# Patient Record
Sex: Female | Born: 1935 | Race: White | Hispanic: No | Smoking: Former smoker
Health system: Southern US, Community
[De-identification: ages and names within clinical notes are randomized; demographics above are authoritative.]

## PROBLEM LIST (undated history)

## (undated) DIAGNOSIS — K219 Gastro-esophageal reflux disease without esophagitis: Secondary | ICD-10-CM

## (undated) DIAGNOSIS — K449 Diaphragmatic hernia without obstruction or gangrene: Secondary | ICD-10-CM

## (undated) DIAGNOSIS — I4891 Unspecified atrial fibrillation: Secondary | ICD-10-CM

## (undated) DIAGNOSIS — G250 Essential tremor: Secondary | ICD-10-CM

## (undated) DIAGNOSIS — K222 Esophageal obstruction: Secondary | ICD-10-CM

## (undated) DIAGNOSIS — K2270 Barrett's esophagus without dysplasia: Secondary | ICD-10-CM

## (undated) DIAGNOSIS — I10 Essential (primary) hypertension: Secondary | ICD-10-CM

## (undated) HISTORY — DX: Unspecified atrial fibrillation: I48.91

## (undated) HISTORY — PX: ESOPHAGOSCOPY WITH DILITATION: SHX5618

## (undated) HISTORY — DX: Essential tremor: G25.0

## (undated) HISTORY — DX: Barrett's esophagus without dysplasia: K22.70

## (undated) HISTORY — DX: Diaphragmatic hernia without obstruction or gangrene: K44.9

## (undated) HISTORY — PX: ROTATOR CUFF REPAIR: SHX139

## (undated) HISTORY — DX: Gastro-esophageal reflux disease without esophagitis: K21.9

## (undated) HISTORY — PX: ABDOMINAL HYSTERECTOMY: SHX81

## (undated) HISTORY — PX: KNEE ARTHROSCOPY: SHX127

## (undated) HISTORY — DX: Esophageal obstruction: K22.2

---

## 2002-11-19 ENCOUNTER — Encounter: Payer: Self-pay | Admitting: Orthopedic Surgery

## 2002-11-19 ENCOUNTER — Ambulatory Visit (HOSPITAL_COMMUNITY): Admission: RE | Admit: 2002-11-19 | Discharge: 2002-11-19 | Payer: Self-pay | Admitting: Orthopedic Surgery

## 2002-12-01 ENCOUNTER — Ambulatory Visit (HOSPITAL_BASED_OUTPATIENT_CLINIC_OR_DEPARTMENT_OTHER): Admission: RE | Admit: 2002-12-01 | Discharge: 2002-12-02 | Payer: Self-pay | Admitting: Orthopedic Surgery

## 2002-12-01 ENCOUNTER — Encounter: Admission: RE | Admit: 2002-12-01 | Discharge: 2002-12-01 | Payer: Self-pay | Admitting: Orthopedic Surgery

## 2002-12-01 ENCOUNTER — Encounter: Payer: Self-pay | Admitting: Orthopedic Surgery

## 2003-06-18 ENCOUNTER — Ambulatory Visit (HOSPITAL_COMMUNITY): Admission: RE | Admit: 2003-06-18 | Discharge: 2003-06-18 | Payer: Self-pay | Admitting: Orthopedic Surgery

## 2003-06-18 ENCOUNTER — Encounter: Payer: Self-pay | Admitting: Orthopedic Surgery

## 2012-09-26 ENCOUNTER — Other Ambulatory Visit: Payer: Self-pay | Admitting: Family Medicine

## 2012-09-26 DIAGNOSIS — R200 Anesthesia of skin: Secondary | ICD-10-CM

## 2012-10-03 ENCOUNTER — Ambulatory Visit
Admission: RE | Admit: 2012-10-03 | Discharge: 2012-10-03 | Disposition: A | Discharge status: Home / Self Care | Payer: Medicare Other | Source: Ambulatory Visit | Attending: Family Medicine | Admitting: Family Medicine

## 2012-10-03 DIAGNOSIS — R200 Anesthesia of skin: Secondary | ICD-10-CM

## 2014-06-15 ENCOUNTER — Other Ambulatory Visit: Payer: Self-pay | Admitting: Family Medicine

## 2014-06-15 DIAGNOSIS — R1310 Dysphagia, unspecified: Secondary | ICD-10-CM

## 2014-06-18 ENCOUNTER — Ambulatory Visit
Admission: RE | Admit: 2014-06-18 | Discharge: 2014-06-18 | Disposition: A | Discharge status: Home / Self Care | Payer: Medicare Other | Source: Ambulatory Visit | Attending: Family Medicine | Admitting: Family Medicine

## 2014-06-18 DIAGNOSIS — R1310 Dysphagia, unspecified: Secondary | ICD-10-CM

## 2014-06-29 ENCOUNTER — Encounter: Payer: Self-pay | Admitting: Gastroenterology

## 2014-08-26 ENCOUNTER — Encounter: Payer: Self-pay | Admitting: Gastroenterology

## 2014-08-26 ENCOUNTER — Other Ambulatory Visit (INDEPENDENT_AMBULATORY_CARE_PROVIDER_SITE_OTHER): Payer: Medicare Other

## 2014-08-26 ENCOUNTER — Ambulatory Visit (INDEPENDENT_AMBULATORY_CARE_PROVIDER_SITE_OTHER): Payer: Medicare Other | Admitting: Gastroenterology

## 2014-08-26 VITALS — BP 152/76 | HR 64 | Ht 67.5 in | Wt 138.0 lb

## 2014-08-26 DIAGNOSIS — Z1212 Encounter for screening for malignant neoplasm of rectum: Secondary | ICD-10-CM

## 2014-08-26 DIAGNOSIS — R531 Weakness: Secondary | ICD-10-CM

## 2014-08-26 DIAGNOSIS — G250 Essential tremor: Secondary | ICD-10-CM

## 2014-08-26 DIAGNOSIS — R1314 Dysphagia, pharyngoesophageal phase: Secondary | ICD-10-CM | POA: Insufficient documentation

## 2014-08-26 DIAGNOSIS — Z1211 Encounter for screening for malignant neoplasm of colon: Secondary | ICD-10-CM

## 2014-08-26 LAB — CBC WITH DIFFERENTIAL/PLATELET
BASOS ABS: 0 10*3/uL (ref 0.0–0.1)
Basophils Relative: 0.7 % (ref 0.0–3.0)
Eosinophils Absolute: 0.3 10*3/uL (ref 0.0–0.7)
Eosinophils Relative: 4.7 % (ref 0.0–5.0)
HCT: 39.5 % (ref 36.0–46.0)
Hemoglobin: 13.3 g/dL (ref 12.0–15.0)
LYMPHS ABS: 2 10*3/uL (ref 0.7–4.0)
LYMPHS PCT: 30.7 % (ref 12.0–46.0)
MCHC: 33.6 g/dL (ref 30.0–36.0)
MCV: 93.5 fl (ref 78.0–100.0)
Monocytes Absolute: 0.5 10*3/uL (ref 0.1–1.0)
Monocytes Relative: 7.9 % (ref 3.0–12.0)
NEUTROS ABS: 3.6 10*3/uL (ref 1.4–7.7)
NEUTROS PCT: 56 % (ref 43.0–77.0)
PLATELETS: 296 10*3/uL (ref 150.0–400.0)
RBC: 4.23 Mil/uL (ref 3.87–5.11)
RDW: 12.7 % (ref 11.5–15.5)
WBC: 6.4 10*3/uL (ref 4.0–10.5)

## 2014-08-26 NOTE — Assessment & Plan Note (Signed)
Dysphagia secondary to distal peptic esophageal stricture  Recommendations #1 upper endoscopy with dilation as indicated  Risks, alternatives, and complications of the procedure, including bleeding, perforation, and possible need for surgery, were explained to the patient.  Dysphagia is secondary to a peptic stricture.

## 2014-08-26 NOTE — Progress Notes (Signed)
  _                                                                                                                History of Present Illness:  Tanya Little is a pleasant 80-year-old white female referred for evaluation of dysphagia.  Over the past year she's had progressive dysphagia to solids and liquids.  Initially with swallowing she has chest pain as well.  Recent barium swallow, which I reviewed, demonstrated a stricture at the GE junction with holdup of a barium pill.  In the past she has had pyrosis which has spontaneously subsided.  Patient takes iron.  She thinks that she has been anemic and claims that she feels lightheaded and weak when she does not take iron.  There is no history of rectal bleeding or melena.  She is on no gastric irritants.  She has never had a colonoscopy.   Past Medical History  Diagnosis Date  . Barrett's esophagus   . Esophageal stricture   . Hiatal hernia   . Essential tremor   . GERD (gastroesophageal reflux disease)    Past Surgical History  Procedure Laterality Date  . Abdominal hysterectomy    . Rotator cuff repair Right    family history includes Aneurysm in her brother; Cancer in her other; Diabetes in her sister; Lung cancer in her brother. Current Outpatient Prescriptions  Medication Sig Dispense Refill  . diphenhydramine-acetaminophen (TYLENOL PM) 25-500 MG TABS Take 2 tablets by mouth at bedtime as needed.    . ferrous sulfate 325 (65 FE) MG tablet Take 325 mg by mouth daily with breakfast.    . Multiple Vitamins-Minerals (CENTRUM SILVER PO) Take 1 tablet by mouth daily.    . Omega-3 Fatty Acids (FISH OIL) 1200 MG CAPS Take 1 capsule by mouth daily.    . PHENobarbital (LUMINAL) 64.8 MG tablet Take 1 tablet by mouth 2 (two) times daily.     No current facility-administered medications for this visit.   Allergies as of 08/26/2014  . (No Known Allergies)    reports that she quit smoking about 10 years ago. Her smoking use  included Cigarettes. She smoked 0.00 packs per day. She has never used smokeless tobacco. She reports that she does not drink alcohol or use illicit drugs.   Review of Systems: she has a resting tremorPertinent positive and negative review of systems were noted in the above HPI section. All other review of systems were otherwise negative.  Vital signs were reviewed in today's medical record Physical Exam: General: Well developed , well nourished, no acute distress Skin: anicteric Head: Normocephalic and atraumatic Eyes:  sclerae anicteric, EOMI Ears: Normal auditory acuity Mouth: No deformity or lesions Neck: Supple, no masses or thyromegaly Lungs: Clear throughout to auscultation Heart: Regular rate and rhythm; no murmurs, rubs or bruits Abdomen: Soft, non tender and non distended. No masses, hepatosplenomegaly or hernias noted. Normal Bowel sounds Rectal:deferred Musculoskeletal: Symmetrical with no gross deformities  Skin: No lesions on visible extremities Pulses:  Normal   pulses noted Extremities: No clubbing, cyanosis, edema or deformities noted Neurological: Alert oriented x 4, grossly nonfocal. There is a resting tremor Cervical Nodes:  No significant cervical adenopathy Inguinal Nodes: No significant inguinal adenopathy Psychological:  Alert and cooperative. Normal mood and affect  See Assessment and Plan under Problem List

## 2014-08-26 NOTE — Assessment & Plan Note (Signed)
Patient has never had a colonoscopy.  She does complain of weakness which she thinks is improved with iron supplementation.  Plan to check CBC as well

## 2014-08-26 NOTE — Assessment & Plan Note (Signed)
Therapy per neurology

## 2014-08-26 NOTE — Patient Instructions (Signed)
You will go to the basement today for labs Your procedure has been scheduled at [facility] Separate instructions given It has been recommended to you by your physician that you have a(n) Colonoscopy  completed. Per your request, we did not schedule the procedure(s) today. Please contact our office at [phone] should you decide to have the procedure completed.

## 2014-08-27 ENCOUNTER — Encounter: Payer: Self-pay | Admitting: Gastroenterology

## 2014-08-27 ENCOUNTER — Ambulatory Visit (AMBULATORY_SURGERY_CENTER): Payer: Medicare Other | Admitting: Gastroenterology

## 2014-08-27 ENCOUNTER — Other Ambulatory Visit: Payer: Self-pay | Admitting: *Deleted

## 2014-08-27 VITALS — BP 174/72 | HR 63 | Temp 96.9°F | Resp 41 | Ht 67.5 in | Wt 138.0 lb

## 2014-08-27 DIAGNOSIS — K2270 Barrett's esophagus without dysplasia: Secondary | ICD-10-CM

## 2014-08-27 DIAGNOSIS — K208 Other esophagitis: Secondary | ICD-10-CM

## 2014-08-27 DIAGNOSIS — Z8719 Personal history of other diseases of the digestive system: Secondary | ICD-10-CM

## 2014-08-27 DIAGNOSIS — R1310 Dysphagia, unspecified: Secondary | ICD-10-CM

## 2014-08-27 DIAGNOSIS — K222 Esophageal obstruction: Secondary | ICD-10-CM

## 2014-08-27 DIAGNOSIS — R1319 Other dysphagia: Secondary | ICD-10-CM

## 2014-08-27 DIAGNOSIS — R1314 Dysphagia, pharyngoesophageal phase: Secondary | ICD-10-CM

## 2014-08-27 MED ORDER — SODIUM CHLORIDE 0.9 % IV SOLN
500.0000 mL | INTRAVENOUS | Status: DC
Start: 2014-08-27 — End: 2014-08-30

## 2014-08-27 NOTE — Op Note (Signed)
[facility] [address] [place], [id number]   1ENDOSCOPY PROCEDURE REPORT  PATIENT: Tanya Little, Tanya Little  MR#: [id number] BIRTHDATE: 1932-04-22 , 82  yrs. old GENDER: female ENDOSCOPIST: Louis Meckelobert D Kaplan, MD REFERRED BY:  Windle GuardWilson Elkins, M.D. PROCEDURE DATE:  08/27/2014 PROCEDURE:  EGD w/ biopsy and EGD w/ balloon dilation ASA CLASS:     Class II INDICATIONS:  dysphagia and history of Barrett's esophagus. MEDICATIONS: Monitored anesthesia care and Propofol 150 mg IV TOPICAL ANESTHETIC:  DESCRIPTION OF PROCEDURE: After the risks benefits and alternatives of the procedure were thoroughly explained, informed consent was obtained.  The LB [id number] endoscope was introduced through the mouth and advanced to the second portion of the duodenum , Without limitations.  The instrument was slowly withdrawn as the mucosa was fully examined.    ESOPHAGUS: There was a benign appearing stricture at the gastroesophageal junction and 36 cm from the incisors.  The stricture was traversable with resistance.  Multiple biopsies were performed using cold forceps because of a history of Barrett's esophagus..  Using a TTS-balloon the stricture was dilated up to 14mm. numbers 11?"[id number] balloon dilators were inflated for 30 seconds each  Following this dilation, there was a moderate amount of heme.   Except for the findings listed the EGD was otherwise normal.  Retroflexed views revealed no abnormalities. The scope was then withdrawn from the patient and the procedure completed.  COMPLICATIONS: There were no immediate complications.  ENDOSCOPIC IMPRESSION: 1.   There was a stricture at the gastroesophageal junction and 36 cm from the incisors; multiple biopsies were performed; Using a TTS-balloon the stricture was dilated up to 11mm; The balloon was held inflated for 30 seconds each; Following this dilation, there was a moderate amount of heme 2.   EGD was otherwise  normal  RECOMMENDATIONS: follow-up endoscopy with balloon dilation in 2-3 weeks  REPEAT EXAM:  eSigned:  Louis Meckelobert D Kaplan, MD 08/27/2014 3:33 PM    CC:

## 2014-08-27 NOTE — Patient Instructions (Addendum)

## 2014-08-27 NOTE — Progress Notes (Signed)
Report to PACU, RN, vss, BBS= Clear.  

## 2014-08-27 NOTE — Progress Notes (Signed)
Called to room to assist during endoscopic procedure.  Patient ID and intended procedure confirmed with present staff. Received instructions for my participation in the procedure from the performing physician.  

## 2014-08-30 ENCOUNTER — Telehealth: Payer: Self-pay

## 2014-08-30 NOTE — Telephone Encounter (Signed)
  Follow up Call-  Call back number 08/27/2014  Post procedure Call Back phone  # [phone]  Permission to leave phone message Yes     Patient questions:  Do you have a fever, pain , or abdominal swelling? No. Pain Score  0 *  Have you tolerated food without any problems? Yes.    Have you been able to return to your normal activities? Yes.    Do you have any questions about your discharge instructions: Diet   No. Medications  No. Follow up visit  No.  Do you have questions or concerns about your Care? No.  Actions: * If pain score is 4 or above: No action needed, pain <4.

## 2014-08-31 ENCOUNTER — Other Ambulatory Visit: Payer: Self-pay

## 2014-08-31 DIAGNOSIS — R1314 Dysphagia, pharyngoesophageal phase: Secondary | ICD-10-CM

## 2014-09-03 ENCOUNTER — Telehealth: Payer: Self-pay | Admitting: Gastroenterology

## 2014-09-03 ENCOUNTER — Encounter: Payer: Self-pay | Admitting: Gastroenterology

## 2014-09-03 NOTE — Telephone Encounter (Signed)
[facility] with dilation [facility]. Appointment with [facility]visit and [facility]

## 2014-09-06 ENCOUNTER — Encounter (HOSPITAL_COMMUNITY)
Admission: RE | Disposition: A | Discharge status: Home / Self Care | Payer: Self-pay | Source: Ambulatory Visit | Attending: Gastroenterology

## 2014-09-06 ENCOUNTER — Ambulatory Visit (HOSPITAL_COMMUNITY)
Admission: RE | Admit: 2014-09-06 | Discharge: 2014-09-06 | Disposition: A | Discharge status: Home / Self Care | Payer: Medicare Other | Source: Ambulatory Visit | Attending: Gastroenterology | Admitting: Gastroenterology

## 2014-09-06 ENCOUNTER — Encounter (HOSPITAL_COMMUNITY): Payer: Self-pay | Admitting: *Deleted

## 2014-09-06 DIAGNOSIS — K210 Gastro-esophageal reflux disease with esophagitis: Secondary | ICD-10-CM | POA: Diagnosis not present

## 2014-09-06 DIAGNOSIS — G250 Essential tremor: Secondary | ICD-10-CM | POA: Diagnosis not present

## 2014-09-06 DIAGNOSIS — Z87891 Personal history of nicotine dependence: Secondary | ICD-10-CM | POA: Diagnosis not present

## 2014-09-06 DIAGNOSIS — K222 Esophageal obstruction: Secondary | ICD-10-CM | POA: Diagnosis not present

## 2014-09-06 DIAGNOSIS — R1310 Dysphagia, unspecified: Secondary | ICD-10-CM

## 2014-09-06 DIAGNOSIS — K209 Esophagitis, unspecified: Secondary | ICD-10-CM | POA: Diagnosis not present

## 2014-09-06 DIAGNOSIS — K2270 Barrett's esophagus without dysplasia: Secondary | ICD-10-CM | POA: Diagnosis not present

## 2014-09-06 HISTORY — PX: BALLOON DILATION: SHX5330

## 2014-09-06 HISTORY — PX: ESOPHAGOGASTRODUODENOSCOPY: SHX5428

## 2014-09-06 SURGERY — EGD (ESOPHAGOGASTRODUODENOSCOPY)
Anesthesia: Moderate Sedation

## 2014-09-06 MED ORDER — DIPHENHYDRAMINE HCL 50 MG/ML IJ SOLN
INTRAMUSCULAR | Status: AC
  Filled 2014-09-06: qty 1

## 2014-09-06 MED ORDER — FENTANYL CITRATE 0.05 MG/ML IJ SOLN
INTRAMUSCULAR | Status: AC
  Filled 2014-09-06: qty 4

## 2014-09-06 MED ORDER — OMEPRAZOLE 20 MG PO CPDR: 20.0000 mg | DELAYED_RELEASE_CAPSULE | Freq: Every day | ORAL | Status: DC

## 2014-09-06 MED ORDER — MIDAZOLAM HCL 10 MG/2ML IJ SOLN
INTRAMUSCULAR | Status: AC
  Filled 2014-09-06: qty 4

## 2014-09-06 MED ORDER — MIDAZOLAM HCL 10 MG/2ML IJ SOLN
INTRAMUSCULAR | Status: DC | PRN
  Administered 2014-09-06: 1 mg via INTRAVENOUS
  Administered 2014-09-06: 2 mg via INTRAVENOUS

## 2014-09-06 MED ORDER — BUTAMBEN-TETRACAINE-BENZOCAINE 2-2-14 % EX AERO
INHALATION_SPRAY | CUTANEOUS | Status: DC | PRN
  Administered 2014-09-06: 2 via TOPICAL

## 2014-09-06 MED ORDER — FENTANYL CITRATE 0.05 MG/ML IJ SOLN
INTRAMUSCULAR | Status: DC | PRN
  Administered 2014-09-06 (×2): 25 ug via INTRAVENOUS

## 2014-09-06 MED ORDER — SODIUM CHLORIDE 0.9 % IV SOLN
INTRAVENOUS | Status: DC
  Administered 2014-09-06: 500 mL via INTRAVENOUS

## 2014-09-06 NOTE — Discharge Instructions (Signed)
Esophagogastroduodenoscopy °Care After °Refer to this sheet in the next few weeks. These instructions provide you with information on caring for yourself after your procedure. Your caregiver may also give you more specific instructions. Your treatment has been planned according to current medical practices, but problems sometimes occur. Call your caregiver if you have any problems or questions after your procedure.  °HOME CARE INSTRUCTIONS °· Do not eat or drink anything until the numbing medicine (local anesthetic) has worn off and your gag reflex has returned. You will know that the local anesthetic has worn off when you can swallow comfortably. °· Do not drive for 12 hours after the procedure or as directed by your caregiver. °· Only take medicines as directed by your caregiver. °SEEK MEDICAL CARE IF:  °· You cannot stop coughing. °· You are not urinating at all or less than usual. °SEEK IMMEDIATE MEDICAL CARE IF: °· You have difficulty swallowing. °· You cannot eat or drink. °· You have worsening throat or chest pain. °· You have dizziness, lightheadedness, or you faint. °· You have nausea or vomiting. °· You have chills. °· You have a fever. °· You have severe abdominal pain. °· You have black, tarry, or bloody stools. °Document Released: 09/17/2012 Document Reviewed: 09/17/2012 °ExitCare® Patient Information ©2015 [employer]. This information is not intended to replace advice given to you by your health care provider. Make sure you discuss any questions you have with your health care provider. ° °

## 2014-09-06 NOTE — Interval H&P Note (Signed)
History and Physical Interval Note:  09/06/2014 9:31 AM  Tanya Little  has presented today for surgery, with the diagnosis of dysphagia  The various methods of treatment have been discussed with the patient and family. After consideration of risks, benefits and other options for treatment, the patient has consented to  Procedure(s): ESOPHAGOGASTRODUODENOSCOPY (EGD) (N/A) BALLOON DILATION (N/A) as a surgical intervention .  The patient's history has been reviewed, patient examined, no change in status, stable for surgery.  I have reviewed the patient's chart and labs.  Questions were answered to the patient's satisfaction.   Upper endoscopy demonstrated a peptic stricture which was dilated to 14 mm.  Patient presents today for follow-up endoscopy and dilation therapy for a distal esophageal stricture.  Melvia Heapsobert Kaplan

## 2014-09-06 NOTE — H&P (View-Only) (Signed)
_                                                                                                                History of Present Illness:  Ms. Tanya Little is a pleasant 80 year old white female referred for evaluation of dysphagia.  Over the past year she's had progressive dysphagia to solids and liquids.  Initially with swallowing she has chest pain as well.  Recent barium swallow, which I reviewed, demonstrated a stricture at the GE junction with holdup of a barium pill.  In the past she has had pyrosis which has spontaneously subsided.  Patient takes iron.  She thinks that she has been anemic and claims that she feels lightheaded and weak when she does not take iron.  There is no history of rectal bleeding or melena.  She is on no gastric irritants.  She has never had a colonoscopy.   Past Medical History  Diagnosis Date  . Barrett's esophagus   . Esophageal stricture   . Hiatal hernia   . Essential tremor   . GERD (gastroesophageal reflux disease)    Past Surgical History  Procedure Laterality Date  . Abdominal hysterectomy    . Rotator cuff repair Right    family history includes Aneurysm in her brother; Cancer in her other; Diabetes in her sister; Lung cancer in her brother. Current Outpatient Prescriptions  Medication Sig Dispense Refill  . diphenhydramine-acetaminophen (TYLENOL PM) 25-500 MG TABS Take 2 tablets by mouth at bedtime as needed.    . ferrous sulfate 325 (65 FE) MG tablet Take 325 mg by mouth daily with breakfast.    . Multiple Vitamins-Minerals (CENTRUM SILVER PO) Take 1 tablet by mouth daily.    . Omega-3 Fatty Acids (FISH OIL) 1200 MG CAPS Take 1 capsule by mouth daily.    Marland Kitchen. PHENobarbital (LUMINAL) 64.8 MG tablet Take 1 tablet by mouth 2 (two) times daily.     No current facility-administered medications for this visit.   Allergies as of 08/26/2014  . (No Known Allergies)    reports that she quit smoking about 10 years ago. Her smoking use  included Cigarettes. She smoked 0.00 packs per day. She has never used smokeless tobacco. She reports that she does not drink alcohol or use illicit drugs.   Review of Systems: she has a resting tremorPertinent positive and negative review of systems were noted in the above HPI section. All other review of systems were otherwise negative.  Vital signs were reviewed in today's medical record Physical Exam: General: Well developed , well nourished, no acute distress Skin: anicteric Head: Normocephalic and atraumatic Eyes:  sclerae anicteric, EOMI Ears: Normal auditory acuity Mouth: No deformity or lesions Neck: Supple, no masses or thyromegaly Lungs: Clear throughout to auscultation Heart: Regular rate and rhythm; no murmurs, rubs or bruits Abdomen: Soft, non tender and non distended. No masses, hepatosplenomegaly or hernias noted. Normal Bowel sounds Rectal:deferred Musculoskeletal: Symmetrical with no gross deformities  Skin: No lesions on visible extremities Pulses:  Normal  pulses noted Extremities: No clubbing, cyanosis, edema or deformities noted Neurological: Alert oriented x 4, grossly nonfocal. There is a resting tremor Cervical Nodes:  No significant cervical adenopathy Inguinal Nodes: No significant inguinal adenopathy Psychological:  Alert and cooperative. Normal mood and affect  See Assessment and Plan under Problem List

## 2014-09-06 NOTE — Op Note (Signed)
18 arland Kitc[id number] [phone] 3930hon HaWilmington Va Medical Cent8mr31Eye Surgical Center LLCCentral MonTre44s86w0dDonaly8175031Maisie F([phone] HaleterTAG>od [id number] Soon SMemorial Hospital Medical Center - ModestoastOwens-Illinoisl Hermann SurgerTresa Endo38Cen54w0dWUJDonalynn [phone] ShoMaisie [id number] [facility]52 Marland KitchenVanessa DurhaNorton Audubon Hospitalm Candelar75ma C203-3[facility]98-5088nt organiser46.9EnvTresa Endo70Gem24w0dWUJDonalynn [phone] ShMaisie [id number] Almira CoaSurgery Center At Liberty Hospital LLCsterKorea20 <BADTEXTLos Robles Hospital & [facility] Center6m3Owens-Illinoissal Center08-1743patient Surgery Center LPAtlantic Gastro[facility]ology EndoTresa Endo53opy60w0dWUJDonalynn [phone] ShMaisie FusHale2212-6891.4-7[facility]52959m66 Vaness29m Du682-0[facility]74-6503EXTTAG>CandelariPam Speciality Hospital Of New Braunfels Celeste

## 2014-09-06 NOTE — OR Nursing (Signed)
IV to right wrist removed with tip intact.  Site WNL.  Patient tolerated well.

## 2014-09-07 ENCOUNTER — Encounter (HOSPITAL_COMMUNITY): Admission: RE | Payer: Self-pay | Source: Ambulatory Visit

## 2014-09-07 ENCOUNTER — Ambulatory Visit (HOSPITAL_COMMUNITY): Admission: RE | Admit: 2014-09-07 | Payer: Medicare Other | Source: Ambulatory Visit | Admitting: Gastroenterology

## 2014-09-07 ENCOUNTER — Encounter (HOSPITAL_COMMUNITY): Payer: Self-pay | Admitting: Gastroenterology

## 2014-09-07 SURGERY — ESOPHAGOGASTRODUODENOSCOPY (EGD) WITH PROPOFOL
Anesthesia: Monitor Anesthesia Care

## 2014-10-11 ENCOUNTER — Telehealth: Payer: Self-pay | Admitting: Gastroenterology

## 2014-10-11 NOTE — Telephone Encounter (Signed)
The patient is having indigestion after eating. She takes her Omeprazole daily as ordered. She would like to try Nexium because her son says it works great for him and he never has any symptoms now. Okay to change? She has had an EGD with Dilation twice this year.

## 2014-10-12 MED ORDER — ESOMEPRAZOLE MAGNESIUM 40 MG PO CPDR: 40.0000 mg | DELAYED_RELEASE_CAPSULE | Freq: Every day | ORAL | Status: DC

## 2014-10-12 NOTE — Telephone Encounter (Signed)
Patient notified and rx sent to pharmacy. 

## 2014-10-12 NOTE — Telephone Encounter (Signed)
That's fine can switch to Nexium 40 mg po QAM

## 2014-10-22 ENCOUNTER — Encounter: Payer: Medicare Other | Admitting: Gastroenterology

## 2015-02-21 ENCOUNTER — Other Ambulatory Visit: Payer: Self-pay | Admitting: *Deleted

## 2015-02-21 MED ORDER — ESOMEPRAZOLE MAGNESIUM 40 MG PO CPDR: 40.0000 mg | DELAYED_RELEASE_CAPSULE | Freq: Every day | ORAL | Status: DC

## 2015-04-04 ENCOUNTER — Other Ambulatory Visit: Payer: Self-pay | Admitting: Family Medicine

## 2015-04-04 ENCOUNTER — Ambulatory Visit
Admission: RE | Admit: 2015-04-04 | Discharge: 2015-04-04 | Disposition: A | Discharge status: Home / Self Care | Payer: Medicare Other | Source: Ambulatory Visit | Attending: Family Medicine | Admitting: Family Medicine

## 2015-04-04 DIAGNOSIS — R609 Edema, unspecified: Secondary | ICD-10-CM

## 2015-04-04 DIAGNOSIS — T148XXA Other injury of unspecified body region, initial encounter: Secondary | ICD-10-CM

## 2015-07-04 ENCOUNTER — Other Ambulatory Visit: Payer: Self-pay | Admitting: *Deleted

## 2015-07-04 MED ORDER — ESOMEPRAZOLE MAGNESIUM 40 MG PO CPDR: 40.0000 mg | DELAYED_RELEASE_CAPSULE | Freq: Every day | ORAL | Status: DC

## 2015-09-19 ENCOUNTER — Telehealth: Payer: Self-pay | Admitting: Gastroenterology

## 2015-09-19 ENCOUNTER — Other Ambulatory Visit: Payer: Self-pay

## 2015-09-19 MED ORDER — NEXIUM 40 MG PO CPDR: 40.0000 mg | DELAYED_RELEASE_CAPSULE | Freq: Every day | ORAL | Status: DC

## 2015-09-19 NOTE — Telephone Encounter (Signed)
Spoke with the patient. She had previously been getting Nexium. It changed to generic about 3 months ago. Confirmed with the pharmacist. Patient reports she is having breakthrough symptoms. New Rx e-scribed and DAW marked. Patient will see new provider in the new year at first available.

## 2015-11-29 ENCOUNTER — Encounter: Payer: Self-pay | Admitting: Gastroenterology

## 2016-03-05 ENCOUNTER — Ambulatory Visit
Admission: RE | Admit: 2016-03-05 | Discharge: 2016-03-05 | Disposition: A | Discharge status: Home / Self Care | Payer: Medicare Other | Source: Ambulatory Visit | Attending: Family Medicine | Admitting: Family Medicine

## 2016-03-05 ENCOUNTER — Other Ambulatory Visit: Payer: Self-pay | Admitting: Family Medicine

## 2016-03-05 DIAGNOSIS — M545 Low back pain: Secondary | ICD-10-CM

## 2016-05-18 ENCOUNTER — Telehealth: Payer: Self-pay | Admitting: *Deleted

## 2016-05-18 NOTE — Telephone Encounter (Signed)
Pt called in requesting records from when she saw Dr. Thad Ranger. She would like a call back as soon as possible. [phone]

## 2016-05-29 ENCOUNTER — Other Ambulatory Visit: Payer: Self-pay | Admitting: Family Medicine

## 2016-05-29 DIAGNOSIS — I739 Peripheral vascular disease, unspecified: Secondary | ICD-10-CM

## 2016-06-05 ENCOUNTER — Ambulatory Visit
Admission: RE | Admit: 2016-06-05 | Discharge: 2016-06-05 | Disposition: A | Discharge status: Home / Self Care | Payer: Medicare Other | Source: Ambulatory Visit | Attending: Family Medicine | Admitting: Family Medicine

## 2016-06-05 DIAGNOSIS — I739 Peripheral vascular disease, unspecified: Secondary | ICD-10-CM

## 2016-11-26 ENCOUNTER — Encounter (INDEPENDENT_AMBULATORY_CARE_PROVIDER_SITE_OTHER): Payer: Self-pay

## 2016-11-26 ENCOUNTER — Telehealth: Payer: Self-pay | Admitting: Emergency Medicine

## 2016-11-26 ENCOUNTER — Ambulatory Visit (INDEPENDENT_AMBULATORY_CARE_PROVIDER_SITE_OTHER): Payer: Medicare Other | Admitting: Nurse Practitioner

## 2016-11-26 ENCOUNTER — Encounter: Payer: Self-pay | Admitting: Nurse Practitioner

## 2016-11-26 VITALS — BP 118/66 | HR 64 | Ht 67.5 in | Wt 137.0 lb

## 2016-11-26 DIAGNOSIS — K219 Gastro-esophageal reflux disease without esophagitis: Secondary | ICD-10-CM

## 2016-11-26 DIAGNOSIS — R1310 Dysphagia, unspecified: Secondary | ICD-10-CM

## 2016-11-26 NOTE — Patient Instructions (Addendum)
You have been scheduled for an endoscopy. Please follow written instructions given to you at your visit today. If you use inhalers (even only as needed), please bring them with you on the day of your procedure. Your physician has requested that you go to www.startemmi.com and enter the access code given to you at your visit today. This web site gives a general overview about your procedure. However, you should still follow specific instructions given to you by our office regarding your preparation for the procedure.  You will be contacted by our office prior to your procedure for directions on holding your Xarelto.  If you do not hear from our office 1 week prior to your scheduled procedure, please call [phone] to discuss.

## 2016-11-26 NOTE — Telephone Encounter (Signed)
11/26/2016   RE: Barron AlvineBetty G Kilian DOB: November 02, 1931 MRN: [id number]   Dear Dr. Aniceto Bossitcher,    We have scheduled the above patient for an endoscopic procedure. Our records show that she is on anticoagulation therapy.   Please advise as to how long the patient may come off her therapy of Xarelto prior to the procedure, which is scheduled for 12-05-16.  Please fax back/ or route the completed form to Deneise Leveresiree Domitila Stetler, CMA.   Sincerely,    Deneise Leveresiree Christell Steinmiller, CMA

## 2016-11-26 NOTE — Progress Notes (Addendum)
     HPI: Patient is an 83 year old female previously followed by Dr. Arlyce Little. She has a history of GERD/peptic stricture requiring dilation, last one November 2015. Patient is here today with recurrent dysphagia. No heartburn symptoms on daily Nexium. She takes Pb for tremors. [facility]  She was recently started on Xarelto for atrial fibrillation. No palpitation, chest pain or dyspnea.   Past Medical History:  Diagnosis Date  . Atrial fibrillation (HCC)   . Barrett's esophagus   . Esophageal stricture   . Essential tremor   . GERD (gastroesophageal reflux disease)   . Hiatal hernia     Patient's surgical history, family medical history, social history, medications and allergies were all reviewed in Epic    Physical Exam: BP 118/66   Pulse 64   Ht 5' 7.5" (1.715 m)   Wt 137 lb (62.1 kg)   BMI 21.14 kg/m   GENERAL: pleasant white female in NAD PSYCH: :Pleasant, cooperative, normal affect HEENT: Normocephalic, conjunctiva pink, mucous membranes moist, neck supple without masses CARDIAC:  Regular rate, irregular rhythm.  No murmur heard,  No peripheral edema PULM: Normal respiratory effort, lungs CTA bilaterally, no wheezing ABDOMEN:  soft, nontender, nondistended, no obvious masses, no hepatomegaly,  normal bowel sounds SKIN:  turgor, no lesions seen Musculoskeletal:  Normal muscle tone, normal strength NEURO: Alert and oriented x 3, tremors in both hands, voice slightly tremulous  ASSESSMENT and PLAN:  731. 83 year old female with recurrent solid food dysphagia and history of peptic strictures requiring dilation, last one 2015 Tanya Dice(Kaplan). She could have recurrent stricture vs dysmotility. Also,  she is [facility] tremors [facility]. She is being evaluated for possible cervical dystonia. Not sure but wonder if dysphagia be somehow be related to cervical dystonia. -Patient will be scheduled for EGD with possible  dilation. The risks and benefits of EGD with dilation were discussed and the patient agrees to proceed. Tanya Little will assume care going forward.   2. Atrial fibrillation, rate controlled. On xarelto.  -Hold Xarelto for 2 days before procedure - will instruct when and how to resume after procedure. Patient understand their is a low but real risk of stroke off Xarelto but agrees to proceed.  Will communicate by phone or EMR with patient's prescribing provider to confirm that holding Xarelto is reasonable in this case.    Tanya Little , NP 11/26/2016, 1:43 PM  Agree with Tanya Little's assessment and plan. Tanya Booparl E. Gessner, MD, Clementeen GrahamFACG

## 2016-11-28 NOTE — Telephone Encounter (Signed)
Called back no answer 

## 2016-11-28 NOTE — Telephone Encounter (Signed)
Spoke to patient and informed her I called Dr. Wendee Coppichter's office and let them know that we need a response this week. They informed me that she is out of the office today and she will be back in the office tomorrow. I will continue to follow up until we get an answer.

## 2016-11-30 NOTE — Telephone Encounter (Signed)
Spoke to Tanya Little at Dr. Wendee Coppichter's office and they advised patient to hold Xarelto the day before and day of her procedure and resume it the day after.

## 2016-12-05 ENCOUNTER — Encounter: Payer: Self-pay | Admitting: Internal Medicine

## 2016-12-05 ENCOUNTER — Ambulatory Visit (AMBULATORY_SURGERY_CENTER): Payer: Medicare Other | Admitting: Internal Medicine

## 2016-12-05 VITALS — BP 145/83 | HR 94 | Temp 97.1°F | Resp 9 | Ht 67.5 in | Wt 137.0 lb

## 2016-12-05 DIAGNOSIS — R1310 Dysphagia, unspecified: Secondary | ICD-10-CM | POA: Diagnosis present

## 2016-12-05 DIAGNOSIS — R1319 Other dysphagia: Secondary | ICD-10-CM

## 2016-12-05 DIAGNOSIS — K222 Esophageal obstruction: Secondary | ICD-10-CM

## 2016-12-05 MED ORDER — NEXIUM 40 MG PO CPDR: 40.0000 mg | DELAYED_RELEASE_CAPSULE | Freq: Every day | ORAL | 3 refills | Status: DC

## 2016-12-05 MED ORDER — SODIUM CHLORIDE 0.9 % IV SOLN: 500.0000 mL | INTRAVENOUS | Status: DC

## 2016-12-05 NOTE — Patient Instructions (Addendum)
I dilated the esophageal stricture - it should make a big difference.  Please restart Xarelto tomorrow.  Make sure to take the Nexium (esomeprazole) I sent in a new prescription. I want you to take it 30 minutes before breakfast.  I appreciate the opportunity to care for you. Iva Booparl E. Garlon Tuggle, MD, FACG     YOU HAD AN ENDOSCOPIC PROCEDURE TODAY AT THE [facility]:   Refer to the procedure report that was given to you for any specific questions about what was found during the examination.  If the procedure report does not answer your questions, please call your gastroenterologist to clarify.  If you requested that your care partner not be given the details of your procedure findings, then the procedure report has been included in a sealed envelope for you to review at your convenience later.  YOU SHOULD EXPECT: Some feelings of bloating in the abdomen. Passage of more gas than usual.  Walking can help get rid of the air that was put into your GI tract during the procedure and reduce the bloating. If you had a lower endoscopy (such as a colonoscopy or flexible sigmoidoscopy) you may notice spotting of blood in your stool or on the toilet paper. If you underwent a bowel prep for your procedure, you may not have a normal bowel movement for a few days.  Please Note:  You might notice some irritation and congestion in your nose or some drainage.  This is from the oxygen used during your procedure.  There is no need for concern and it should clear up in a day or so.  SYMPTOMS TO REPORT IMMEDIATELY:    Following upper endoscopy (EGD)  Vomiting of blood or coffee ground material  New chest pain or pain under the shoulder blades  Painful or persistently difficult swallowing  New shortness of breath  Fever of 100F or higher  Black, tarry-looking stools  For urgent or emergent issues, a gastroenterologist can be reached at any hour by calling [phone].   DIET:  Please  follow the dilatation diet the rest of today.  A handout was given to your daughter. Drink plenty of fluids but you should avoid alcoholic beverages for 24 hours.  ACTIVITY:  You should plan to take it easy for the rest of today and you should NOT DRIVE or use heavy machinery until tomorrow (because of the sedation medicines used during the test).    FOLLOW UP: Our staff will call the number listed on your records the next business day following your procedure to check on you and address any questions or concerns that you may have regarding the information given to you following your procedure. If we do not reach you, we will leave a message.  However, if you are feeling well and you are not experiencing any problems, there is no need to return our call.  We will assume that you have returned to your regular daily activities without incident.  If any biopsies were taken you will be contacted by phone or by letter within the next 1-3 weeks.  Please call us at [phone] if you have not heard about the biopsies in 3 weeks.    SIGNATURES/CONFIDENTIALITY: You and/or your care partner have signed paperwork which will be entered into your electronic medical record.  These signatures attest to the fact that that the information above on your After Visit Summary has been reviewed and is understood.  Full responsibility of the confidentiality of this  discharge information lies with you and/or your care-partner.   Handouts were given to your care partner on the dilatation diet to follow the rest of today and a hiatal hernia. You may resume your current medications today.  Be sure to stay on PPI (acid reducing med) and take it 30 minutes before breakfast.  Please resume XARELTO at prior dose tomorrow per Dr. Leone Payor. Repeat upper Endoscopy as needed for retreatment. Please call if any questions or concerns.

## 2016-12-05 NOTE — Op Note (Signed)
[facility] Patient Name: Tanya ScrapeBetty Traweek Procedure Date: 12/05/2016 10:29 AM MRN: [id number] Endoscopist: Iva Booparl E Fayelynn Distel , MD Age: 83 Referring MD:  Date of Birth: November 23, 1931 Gender: Female Account #: [id number] Procedure:                Upper GI endoscopy Indications:              Dysphagia, Stricture of the esophagus, For therapy                            of esophageal stricture Medicines:                Propofol per Anesthesia, Monitored Anesthesia Care Procedure:                Pre-Anesthesia Assessment:                           - Prior to the procedure, a History and Physical                            was performed, and patient medications and                            allergies were reviewed. The patient's tolerance of                            previous anesthesia was also reviewed. The risks                            and benefits of the procedure and the sedation                            options and risks were discussed with the patient.                            All questions were answered, and informed consent                            was obtained. Prior Anticoagulants: The patient                            last took Xarelto (rivaroxaban) 1 day prior to the                            procedure. ASA Grade Assessment: II - A patient                            with mild systemic disease. After reviewing the                            risks and benefits, the patient was deemed in                            satisfactory condition to undergo the procedure.  After obtaining informed consent, the endoscope was                            passed under direct vision. Throughout the                            procedure, the patient's blood pressure, pulse, and                            oxygen saturations were monitored continuously. The                            Model GIF-HQ190 [id number]) scope was introduced   through the mouth, and advanced to the prepyloric                            region, stomach. The upper GI endoscopy was                            accomplished without difficulty. The patient                            tolerated the procedure well. Scope In: Scope Out: Findings:                 One moderate benign-appearing, intrinsic stenosis                            was found at the gastroesophageal junction. This                            measured 1.5 cm (inner diameter) x 1 cm (in length)                            and was traversed. A TTS dilator was passed through                            the scope. Dilation with a 16-17-18 mm balloon                            dilator was performed to 18 mm. The dilation site                            was examined following endoscope reinsertion and                            showed complete resolution of luminal narrowing.                            Estimated blood loss was minimal.                           A 5 cm hiatal hernia was present.  The exam was otherwise without abnormality.                           The cardia and gastric fundus were normal on                            retroflexion. Complications:            No immediate complications. Estimated Blood Loss:     Estimated blood loss was minimal. Impression:               - Benign-appearing esophageal stenosis. Dilated.                           - 5 cm hiatal hernia.                           - The examination was otherwise normal.                           - No specimens collected. Recommendation:           - Patient has a contact number available for                            emergencies. The signs and symptoms of potential                            delayed complications were discussed with the                            patient. Return to normal activities tomorrow.                            Written discharge instructions were provided to the                             patient.                           - Clear liquids x 1 hour then soft foods rest of                            day. Start prior diet tomorrow.                           - Continue present medications.                           - Be sure to stay on PPI (Nexium) and take 30 mins                            before breakfast                           - Repeat upper endoscopy PRN for retreatment.                           -  Resume Xarelto (rivaroxaban) at prior dose                            tomorrow. [Management]. Iva Boop, MD 12/05/2016 10:53:03 AM This report has been signed electronically.

## 2016-12-05 NOTE — Progress Notes (Signed)
Called to room to assist during endoscopic procedure.  Patient ID and intended procedure confirmed with present staff. Received instructions for my participation in the procedure from the performing physician.  

## 2016-12-05 NOTE — Progress Notes (Signed)
No problems noted in the recovery room. maw 

## 2016-12-05 NOTE — Progress Notes (Signed)
Valda FaviaLynn Beason, CRNA helped pt put her dentures back in her mouth before her daughter came into the recovery room. maw

## 2016-12-06 ENCOUNTER — Telehealth: Payer: Self-pay | Admitting: *Deleted

## 2016-12-06 NOTE — Telephone Encounter (Signed)
  Follow up Call-  Call back number 12/05/2016 08/27/2014  Post procedure Call Back phone  # [phone]  Permission to leave phone message Yes Yes  Some recent data might be hidden     Patient questions:  Do you have a fever, pain , or abdominal swelling? No. Pain Score  0 *  Have you tolerated food without any problems? Yes.    Have you been able to return to your normal activities? Yes.    Do you have any questions about your discharge instructions: Diet   No. Medications  No. Follow up visit  No.  Do you have questions or concerns about your Care? No.  Actions: * If pain score is 4 or above: No action needed, pain <4.

## 2016-12-28 ENCOUNTER — Inpatient Hospital Stay (HOSPITAL_COMMUNITY)
Admission: EM | Admit: 2016-12-28 | Discharge: 2016-12-31 | DRG: 690 | Disposition: A | Discharge status: Skilled Nursing Facility | Payer: Medicare Other | Attending: Internal Medicine | Admitting: Internal Medicine

## 2016-12-28 ENCOUNTER — Emergency Department (HOSPITAL_COMMUNITY): Payer: Medicare Other

## 2016-12-28 ENCOUNTER — Encounter (HOSPITAL_COMMUNITY): Payer: Self-pay | Admitting: Family Medicine

## 2016-12-28 DIAGNOSIS — B9620 Unspecified Escherichia coli [E. coli] as the cause of diseases classified elsewhere: Secondary | ICD-10-CM | POA: Diagnosis present

## 2016-12-28 DIAGNOSIS — G250 Essential tremor: Secondary | ICD-10-CM | POA: Diagnosis present

## 2016-12-28 DIAGNOSIS — Z90710 Acquired absence of both cervix and uterus: Secondary | ICD-10-CM

## 2016-12-28 DIAGNOSIS — K2270 Barrett's esophagus without dysplasia: Secondary | ICD-10-CM | POA: Diagnosis present

## 2016-12-28 DIAGNOSIS — Z7901 Long term (current) use of anticoagulants: Secondary | ICD-10-CM

## 2016-12-28 DIAGNOSIS — R4701 Aphasia: Secondary | ICD-10-CM | POA: Diagnosis present

## 2016-12-28 DIAGNOSIS — E860 Dehydration: Secondary | ICD-10-CM | POA: Diagnosis present

## 2016-12-28 DIAGNOSIS — Z79899 Other long term (current) drug therapy: Secondary | ICD-10-CM

## 2016-12-28 DIAGNOSIS — I4891 Unspecified atrial fibrillation: Secondary | ICD-10-CM | POA: Diagnosis present

## 2016-12-28 DIAGNOSIS — E871 Hypo-osmolality and hyponatremia: Secondary | ICD-10-CM | POA: Diagnosis not present

## 2016-12-28 DIAGNOSIS — G9340 Encephalopathy, unspecified: Secondary | ICD-10-CM | POA: Diagnosis present

## 2016-12-28 DIAGNOSIS — K219 Gastro-esophageal reflux disease without esophagitis: Secondary | ICD-10-CM | POA: Diagnosis present

## 2016-12-28 DIAGNOSIS — R4182 Altered mental status, unspecified: Secondary | ICD-10-CM | POA: Diagnosis present

## 2016-12-28 DIAGNOSIS — R531 Weakness: Secondary | ICD-10-CM | POA: Diagnosis not present

## 2016-12-28 DIAGNOSIS — N390 Urinary tract infection, site not specified: Secondary | ICD-10-CM | POA: Diagnosis present

## 2016-12-28 DIAGNOSIS — E785 Hyperlipidemia, unspecified: Secondary | ICD-10-CM | POA: Diagnosis present

## 2016-12-28 DIAGNOSIS — Z87891 Personal history of nicotine dependence: Secondary | ICD-10-CM

## 2016-12-28 LAB — URINALYSIS, ROUTINE W REFLEX MICROSCOPIC
BILIRUBIN URINE: NEGATIVE
GLUCOSE, UA: NEGATIVE mg/dL
HGB URINE DIPSTICK: NEGATIVE
Ketones, ur: 5 mg/dL — AB
LEUKOCYTES UA: NEGATIVE
NITRITE: POSITIVE — AB
PH: 6 (ref 5.0–8.0)
Protein, ur: NEGATIVE mg/dL
RBC / HPF: NONE SEEN RBC/hpf (ref 0–5)
SPECIFIC GRAVITY, URINE: 1.011 (ref 1.005–1.030)

## 2016-12-28 LAB — CBC WITH DIFFERENTIAL/PLATELET
BASOS PCT: 0 %
Basophils Absolute: 0 10*3/uL (ref 0.0–0.1)
EOS ABS: 0.3 10*3/uL (ref 0.0–0.7)
Eosinophils Relative: 3 %
HEMATOCRIT: 34.7 % — AB (ref 36.0–46.0)
Hemoglobin: 12.4 g/dL (ref 12.0–15.0)
LYMPHS ABS: 2.9 10*3/uL (ref 0.7–4.0)
Lymphocytes Relative: 32 %
MCH: 31.6 pg (ref 26.0–34.0)
MCHC: 35.7 g/dL (ref 30.0–36.0)
MCV: 88.3 fL (ref 78.0–100.0)
MONO ABS: 0.8 10*3/uL (ref 0.1–1.0)
MONOS PCT: 9 %
NEUTROS PCT: 56 %
Neutro Abs: 5.1 10*3/uL (ref 1.7–7.7)
Platelets: 275 10*3/uL (ref 150–400)
RBC: 3.93 MIL/uL (ref 3.87–5.11)
RDW: 11.9 % (ref 11.5–15.5)
WBC: 9.1 10*3/uL (ref 4.0–10.5)

## 2016-12-28 LAB — COMPREHENSIVE METABOLIC PANEL
ALT: 21 U/L (ref 14–54)
ANION GAP: 8 (ref 5–15)
AST: 21 U/L (ref 15–41)
Albumin: 4 g/dL (ref 3.5–5.0)
Alkaline Phosphatase: 56 U/L (ref 38–126)
BILIRUBIN TOTAL: 0.5 mg/dL (ref 0.3–1.2)
BUN: 16 mg/dL (ref 6–20)
CALCIUM: 9.1 mg/dL (ref 8.9–10.3)
CO2: 22 mmol/L (ref 22–32)
Chloride: 94 mmol/L — ABNORMAL LOW (ref 101–111)
Creatinine, Ser: 0.8 mg/dL (ref 0.44–1.00)
Glucose, Bld: 114 mg/dL — ABNORMAL HIGH (ref 65–99)
POTASSIUM: 4 mmol/L (ref 3.5–5.1)
Sodium: 124 mmol/L — ABNORMAL LOW (ref 135–145)
TOTAL PROTEIN: 7 g/dL (ref 6.5–8.1)

## 2016-12-28 LAB — I-STAT TROPONIN, ED: TROPONIN I, POC: 0 ng/mL (ref 0.00–0.08)

## 2016-12-28 LAB — BLOOD GAS, VENOUS
Acid-Base Excess: 0.1 mmol/L (ref 0.0–2.0)
BICARBONATE: 22.1 mmol/L (ref 20.0–28.0)
FIO2: 21
O2 Saturation: 62.2 %
PCO2 VEN: 28.7 mmHg — AB (ref 44.0–60.0)
PH VEN: 7.498 — AB (ref 7.250–7.430)
Patient temperature: 98.6
pO2, Ven: 31.3 mmHg — CL (ref 32.0–45.0)

## 2016-12-28 LAB — I-STAT CHEM 8, ED
BUN: 16 mg/dL (ref 6–20)
CALCIUM ION: 1.13 mmol/L — AB (ref 1.15–1.40)
CHLORIDE: 94 mmol/L — AB (ref 101–111)
Creatinine, Ser: 0.8 mg/dL (ref 0.44–1.00)
GLUCOSE: 108 mg/dL — AB (ref 65–99)
HCT: 37 % (ref 36.0–46.0)
Hemoglobin: 12.6 g/dL (ref 12.0–15.0)
Potassium: 4.1 mmol/L (ref 3.5–5.1)
Sodium: 127 mmol/L — ABNORMAL LOW (ref 135–145)
TCO2: 22 mmol/L (ref 0–100)

## 2016-12-28 LAB — AMMONIA: AMMONIA: 15 umol/L (ref 9–35)

## 2016-12-28 MED ORDER — SODIUM CHLORIDE 0.9 % IV BOLUS (SEPSIS)
1000.0000 mL | Freq: Once | INTRAVENOUS | Status: AC
  Administered 2016-12-28: 1000 mL via INTRAVENOUS

## 2016-12-28 MED ORDER — SODIUM CHLORIDE 0.9 % IV SOLN
Freq: Once | INTRAVENOUS | Status: AC
  Administered 2016-12-28: 23:00:00 via INTRAVENOUS

## 2016-12-28 MED ORDER — DEXTROSE 5 % IV SOLN
1.0000 g | Freq: Once | INTRAVENOUS | Status: AC
  Administered 2016-12-28: 1 g via INTRAVENOUS
  Filled 2016-12-28: qty 10

## 2016-12-28 MED ORDER — SODIUM CHLORIDE 0.9 % IV SOLN: INTRAVENOUS | Status: DC

## 2016-12-28 NOTE — ED Notes (Signed)
Call to 4 east room 1420 has not been approved by charge nurse and is not a ready bed per secretary 1400.

## 2016-12-28 NOTE — ED Notes (Signed)
PT C/O OF CHEST PAIN DURING ASSESSMENT HOWEVER UNABLE TO STATE WHEN IT BEGAN THEN STATES ITS BEEN ALL DAY. EDP DAN CALLED TO BEDSIDE TO EVALUATE

## 2016-12-28 NOTE — ED Notes (Signed)
Patient transported to X-ray 

## 2016-12-28 NOTE — ED Notes (Signed)
ED Provider at bedside. EDP DAN PRESENT

## 2016-12-28 NOTE — ED Notes (Signed)
Patient transported to CT 

## 2016-12-28 NOTE — ED Notes (Signed)
Family at bedside. 

## 2016-12-28 NOTE — ED Provider Notes (Signed)
[facility]-EMERGENCY DEPT Provider Note   CSN: [id number] Arrival date & time: 12/28/16  1847     History   Chief Complaint Chief Complaint  Patient presents with  . Headache  . Aphasia  . Altered Mental Status    HPI Tanya Little is a 83 y.o. female.  83 yo F with a chief complaint of change in mental status and weakness. This been going on since likely this morning notes difficulty with the timeline. She was seen at lunch was acting somewhat oddly. This evening she was very depressed and difficult to talk to. When he realized she was having trouble finding words states it hurts in the emergency department. They deny that she's had fever cough congestion abdominal pain vomiting or diarrhea. Patient's history is somewhat limited because she is slowed responses and usually replies that she is having some chest pain. Unable to further explain her pain.  Level V caveat altered mental status.   The history is provided by the patient.  Headache   Pertinent negatives include no fever, no palpitations, no shortness of breath, no nausea and no vomiting.  Altered Mental Status   This is a new problem. The current episode started 12 to 24 hours ago. The problem has not changed since onset.Associated symptoms include confusion and weakness. Her past medical history does not include seizures.    Past Medical History:  Diagnosis Date  . Atrial fibrillation (HCC)   . Barrett's esophagus   . Esophageal stricture   . Essential tremor   . GERD (gastroesophageal reflux disease)   . Hiatal hernia     Patient Active Problem List   Diagnosis Date Noted  . Esophageal stricture 09/06/2014  . Dysphagia, pharyngoesophageal phase 08/26/2014  . Encounter for colorectal cancer screening 08/26/2014  . Essential tremor 08/26/2014    Past Surgical History:  Procedure Laterality Date  . ABDOMINAL HYSTERECTOMY    . BALLOON DILATION N/A 09/06/2014   Procedure: BALLOON DILATION;  Surgeon: Louis Meckel, MD;  Location: [facility];  Service: Endoscopy;  Laterality: N/A;  . ESOPHAGOGASTRODUODENOSCOPY N/A 09/06/2014   Procedure: ESOPHAGOGASTRODUODENOSCOPY (EGD);  Surgeon: Louis Meckel, MD;  Location: [facility];  Service: Endoscopy;  Laterality: N/A;  . KNEE ARTHROSCOPY Right   . ROTATOR CUFF REPAIR Right     OB History    No data available       Home Medications    Prior to Admission medications   Medication Sig Start Date End Date Taking? Authorizing Provider  CRANBERRY PO Take by mouth.   Yes Historical Provider, MD  diphenhydramine-acetaminophen (TYLENOL PM) 25-500 MG TABS Take 2 tablets by mouth at bedtime as needed.   Yes Historical Provider, MD  ferrous sulfate 325 (65 FE) MG tablet Take 325 mg by mouth daily with breakfast.   Yes Historical Provider, MD  Multiple Vitamins-Minerals (CENTRUM SILVER PO) Take 1 tablet by mouth daily.   Yes Historical Provider, MD  NEXIUM 40 MG capsule Take 1 capsule (40 mg total) by mouth daily before breakfast. 12/05/16  Yes Iva Boop, MD  Omega-3 Fatty Acids (FISH OIL) 1200 MG CAPS Take 1 capsule by mouth daily.   Yes Historical Provider, MD  PHENobarbital (LUMINAL) 64.8 MG tablet Take 64.8 mg by mouth 2 (two) times daily.   Yes Historical Provider, MD  rivaroxaban (XARELTO) 20 MG TABS tablet Take 20 mg by mouth daily with supper.   Yes Historical Provider, MD    Family History Family History  Problem Relation  Age of Onset  . Diabetes Sister   . Lung cancer Brother   . Cancer Other     nephew  . Aneurysm Brother     x 3    Social History Social History  Substance Use Topics  . Smoking status: Former Smoker    Types: Cigarettes    Quit date: 08/26/2004  . Smokeless tobacco: Never Used  . Alcohol use No     Allergies   Patient has no known allergies.   Review of Systems Review of Systems  Unable to perform ROS: Mental status change  Constitutional: Negative for chills and fever.  HENT: Negative for  congestion and rhinorrhea.   Eyes: Negative for redness and visual disturbance.  Respiratory: Negative for shortness of breath and wheezing.   Cardiovascular: Positive for chest pain. Negative for palpitations.  Gastrointestinal: Negative for nausea and vomiting.  Genitourinary: Negative for dysuria and urgency.  Musculoskeletal: Negative for arthralgias and myalgias.  Skin: Negative for pallor and wound.  Neurological: Positive for weakness. Negative for dizziness and headaches.  Psychiatric/Behavioral: Positive for confusion.     Physical Exam Updated Vital Signs Pulse 83   Temp 97.3 F (36.3 C) (Rectal)   Physical Exam  Constitutional: She is oriented to person, place, and time. She appears well-developed and well-nourished. No distress.  HENT:  Head: Normocephalic and atraumatic.  Eyes: EOM are normal. Pupils are equal, round, and reactive to light.  Neck: Normal range of motion. Neck supple.  Cardiovascular: Normal rate and regular rhythm.  Exam reveals no gallop and no friction rub.   No murmur heard. Pulmonary/Chest: Effort normal. She has no wheezes. She has no rales.  Abdominal: Soft. She exhibits no distension. There is no tenderness.  Musculoskeletal: She exhibits no edema or tenderness.  Neurological: She is alert and oriented to person, place, and time.  Coarse tremor. Patient not following all commands. Somewhat slow to respond. Left upper extremity 4 out of 5 versus right upper extremity 5 out of 5. Slight left facial droop. Mild difficulty with word finding.  Skin: Skin is warm and dry. She is not diaphoretic.  Psychiatric: She has a normal mood and affect. Her behavior is normal.  Nursing note and vitals reviewed.    ED Treatments / Results  Labs (all labs ordered are listed, but only abnormal results are displayed) Labs Reviewed  CBC WITH DIFFERENTIAL/PLATELET - Abnormal; Notable for the following:       Result Value   HCT 34.7 (*)    All other  components within normal limits  COMPREHENSIVE METABOLIC PANEL - Abnormal; Notable for the following:    Sodium 124 (*)    Chloride 94 (*)    Glucose, Bld 114 (*)    All other components within normal limits  URINALYSIS, ROUTINE W REFLEX MICROSCOPIC - Abnormal; Notable for the following:    APPearance HAZY (*)    Ketones, ur 5 (*)    Nitrite POSITIVE (*)    Bacteria, UA RARE (*)    Squamous Epithelial / LPF 0-5 (*)    All other components within normal limits  BLOOD GAS, VENOUS - Abnormal; Notable for the following:    pH, Ven 7.498 (*)    pCO2, Ven 28.7 (*)    pO2, Ven 31.3 (*)    All other components within normal limits  I-STAT CHEM 8, ED - Abnormal; Notable for the following:    Sodium 127 (*)    Chloride 94 (*)    Glucose, Bld  108 (*)    Calcium, Ion 1.13 (*)    All other components within normal limits  URINE CULTURE  AMMONIA  I-STAT TROPOININ, ED    EKG  EKG Interpretation  Date/Time:  Friday December 28 2016 19:16:22 EDT Ventricular Rate:  107 PR Interval:    QRS Duration: 91 QT Interval:  344 QTC Calculation: 459 R Axis:   -36 Text Interpretation:  Atrial fibrillation Left axis deviation Borderline low voltage, extremity leads Probable anteroseptal infarct, old Confirmed by Raynald Rouillard MD, DANIEL [id number]) on 12/28/2016 9:13:24 PM       Radiology Dg Chest 2 View  Result Date: 12/28/2016 CLINICAL DATA:  83 y/o  F; chest pain. EXAM: CHEST  2 VIEW COMPARISON:  None. FINDINGS: The heart size and mediastinal contours are within normal limits. Aortic atherosclerosis with calcification. Chronic right lateral rib fracture deformities. No focal consolidation, effusion, or pneumothorax identified. Minor biapical pleuroparenchymal scarring. IMPRESSION: No active cardiopulmonary disease. Electronically Signed   By: Mitzi HansenLance  Furusawa-Stratton M.D.   On: 12/28/2016 19:58   Ct Head Wo Contrast  Result Date: 12/28/2016 CLINICAL DATA:  Altered mental status. EXAM: CT HEAD WITHOUT  CONTRAST TECHNIQUE: Contiguous axial images were obtained from the base of the skull through the vertex without intravenous contrast. COMPARISON:  Brain MRI 10/03/2012 FINDINGS: Brain: There is no evidence of acute cortical infarct, intracranial hemorrhage, mass, midline shift, or extra-axial fluid collection. A mega cisterna magna is incidentally noted. T2 hyperintensities in the deep cerebral white matter and basal ganglia bilaterally correspond to a combination of chronic small vessel ischemic disease/chronic lacunar infarcts and dilated perivascular spaces on the prior MRI. Mild cerebral atrophy is within normal limits for age. Vascular: No hyperdense vessel or unexpected calcification. Skull: No fracture or focal osseous lesion. Sinuses/Orbits: Visualized paranasal sinuses and mastoid air cells are clear. No evidence of acute orbital abnormality. Other: None. IMPRESSION: 1. No evidence of acute intracranial abnormality. 2. Mild chronic small vessel ischemic disease. Electronically Signed   By: Sebastian AcheAllen  Grady M.D.   On: 12/28/2016 20:07    Procedures Procedures (including critical care time)  Medications Ordered in ED Medications  cefTRIAXone (ROCEPHIN) 1 g in dextrose 5 % 50 mL IVPB (not administered)  0.9 %  sodium chloride infusion (not administered)  sodium chloride 0.9 % bolus 1,000 mL (1,000 mLs Intravenous New Bag/Given 12/28/16 1957)     Initial Impression / Assessment and Plan / ED Course  I have reviewed the triage vital signs and the nursing notes.  Pertinent labs & imaging results that were available during my care of the patient were reviewed by me and considered in my medical decision making (see chart for details).     83 yo F With a chief complaint of altered mental status. This is been going on since this morning. Timeline is imprecise. Patient is confused difficulty obtaining history. Family states she's been having difficulty with her speech and seems slow to respond. They  said she has been losing weight over the past months to years. Evaluation here with hyponatremia as well as nitrates in the urine. I discussed the patient's case with Dr. Amada JupiterKirkpatrick, neurology. She had some focal left upper extremity weakness. He feels that she is likely out of the window and feels that a UTI or a sodium level that low may be the cause of some of her symptoms. We'll start her on a normal saline infusion. Give Keflex. Urine sent for culture.  The patients results and plan were reviewed and discussed.  Any x-rays performed were independently reviewed by myself.   Differential diagnosis were considered with the presenting HPI.  Medications  cefTRIAXone (ROCEPHIN) 1 g in dextrose 5 % 50 mL IVPB (not administered)  0.9 %  sodium chloride infusion (not administered)  sodium chloride 0.9 % bolus 1,000 mL (1,000 mLs Intravenous New Bag/Given 12/28/16 1957)    Vitals:   12/28/16 2113  Pulse: 83  Temp: 97.3 F (36.3 C)  TempSrc: Rectal    Final diagnoses:  Altered mental status, unspecified altered mental status type  Left-sided weakness    Admission/ observation were discussed with the admitting physician, patient and/or family and they are comfortable with the plan.    Final Clinical Impressions(s) / ED Diagnoses   Final diagnoses:  Altered mental status, unspecified altered mental status type  Left-sided weakness    New Prescriptions New Prescriptions   No medications on file     Melene Plan, DO 12/29/16 1521

## 2016-12-29 ENCOUNTER — Encounter (HOSPITAL_COMMUNITY): Payer: Self-pay | Admitting: Family Medicine

## 2016-12-29 ENCOUNTER — Inpatient Hospital Stay (HOSPITAL_COMMUNITY): Payer: Medicare Other

## 2016-12-29 DIAGNOSIS — E871 Hypo-osmolality and hyponatremia: Secondary | ICD-10-CM

## 2016-12-29 DIAGNOSIS — G250 Essential tremor: Secondary | ICD-10-CM

## 2016-12-29 DIAGNOSIS — G9340 Encephalopathy, unspecified: Secondary | ICD-10-CM

## 2016-12-29 DIAGNOSIS — I4891 Unspecified atrial fibrillation: Secondary | ICD-10-CM

## 2016-12-29 DIAGNOSIS — N390 Urinary tract infection, site not specified: Principal | ICD-10-CM

## 2016-12-29 LAB — OSMOLALITY, URINE: Osmolality, Ur: 380 mOsm/kg (ref 300–900)

## 2016-12-29 LAB — URINALYSIS, ROUTINE W REFLEX MICROSCOPIC
Bilirubin Urine: NEGATIVE
GLUCOSE, UA: NEGATIVE mg/dL
Hgb urine dipstick: NEGATIVE
KETONES UR: 20 mg/dL — AB
Nitrite: POSITIVE — AB
PH: 5 (ref 5.0–8.0)
PROTEIN: NEGATIVE mg/dL
Specific Gravity, Urine: 1.016 (ref 1.005–1.030)

## 2016-12-29 LAB — BASIC METABOLIC PANEL
Anion gap: 9 (ref 5–15)
BUN: 14 mg/dL (ref 6–20)
CALCIUM: 8.1 mg/dL — AB (ref 8.9–10.3)
CHLORIDE: 98 mmol/L — AB (ref 101–111)
CO2: 20 mmol/L — ABNORMAL LOW (ref 22–32)
CREATININE: 0.76 mg/dL (ref 0.44–1.00)
Glucose, Bld: 109 mg/dL — ABNORMAL HIGH (ref 65–99)
Potassium: 3.6 mmol/L (ref 3.5–5.1)
SODIUM: 127 mmol/L — AB (ref 135–145)

## 2016-12-29 LAB — GLUCOSE, CAPILLARY: Glucose-Capillary: 86 mg/dL (ref 65–99)

## 2016-12-29 LAB — CREATININE, URINE, RANDOM: CREATININE, URINE: 63.97 mg/dL

## 2016-12-29 LAB — SODIUM, URINE, RANDOM: SODIUM UR: 52 mmol/L

## 2016-12-29 LAB — TSH: TSH: 1.345 u[IU]/mL (ref 0.350–4.500)

## 2016-12-29 LAB — CORTISOL: CORTISOL PLASMA: 6.8 ug/dL

## 2016-12-29 LAB — OSMOLALITY: Osmolality: 270 mOsm/kg — ABNORMAL LOW (ref 275–295)

## 2016-12-29 MED ORDER — RIVAROXABAN 20 MG PO TABS
20.0000 mg | ORAL_TABLET | Freq: Every day | ORAL | Status: DC
  Administered 2016-12-29 – 2016-12-31 (×3): 20 mg via ORAL
  Filled 2016-12-29 (×3): qty 1

## 2016-12-29 MED ORDER — BISACODYL 5 MG PO TBEC
5.0000 mg | DELAYED_RELEASE_TABLET | Freq: Every day | ORAL | Status: DC | PRN
  Administered 2016-12-30: 5 mg via ORAL
  Filled 2016-12-29: qty 1

## 2016-12-29 MED ORDER — ACETAMINOPHEN 325 MG PO TABS
650.0000 mg | ORAL_TABLET | Freq: Four times a day (QID) | ORAL | Status: DC | PRN
  Administered 2016-12-29 – 2016-12-30 (×2): 650 mg via ORAL
  Filled 2016-12-29 (×2): qty 2

## 2016-12-29 MED ORDER — ENSURE ENLIVE PO LIQD
237.0000 mL | Freq: Two times a day (BID) | ORAL | Status: DC
  Administered 2016-12-29 – 2016-12-30 (×2): 237 mL via ORAL

## 2016-12-29 MED ORDER — ONDANSETRON HCL 4 MG PO TABS: 4.0000 mg | ORAL_TABLET | Freq: Four times a day (QID) | ORAL | Status: DC | PRN

## 2016-12-29 MED ORDER — OMEGA-3-ACID ETHYL ESTERS 1 G PO CAPS
1.0000 g | ORAL_CAPSULE | Freq: Every day | ORAL | Status: DC
  Administered 2016-12-29 – 2016-12-31 (×3): 1 g via ORAL
  Filled 2016-12-29 (×3): qty 1

## 2016-12-29 MED ORDER — ONDANSETRON HCL 4 MG/2ML IJ SOLN: 4.0000 mg | Freq: Four times a day (QID) | INTRAMUSCULAR | Status: DC | PRN

## 2016-12-29 MED ORDER — ACETAMINOPHEN 650 MG RE SUPP: 650.0000 mg | Freq: Four times a day (QID) | RECTAL | Status: DC | PRN

## 2016-12-29 MED ORDER — DILTIAZEM HCL 25 MG/5ML IV SOLN
15.0000 mg | Freq: Once | INTRAVENOUS | Status: AC
  Administered 2016-12-29: 15 mg via INTRAVENOUS
  Filled 2016-12-29: qty 5

## 2016-12-29 MED ORDER — PANTOPRAZOLE SODIUM 40 MG PO TBEC
40.0000 mg | DELAYED_RELEASE_TABLET | Freq: Every day | ORAL | Status: DC
  Administered 2016-12-29 – 2016-12-31 (×3): 40 mg via ORAL
  Filled 2016-12-29 (×3): qty 1

## 2016-12-29 MED ORDER — PHENOBARBITAL 32.4 MG PO TABS: 64.8000 mg | ORAL_TABLET | Freq: Two times a day (BID) | ORAL | Status: DC

## 2016-12-29 MED ORDER — HYDROCODONE-ACETAMINOPHEN 5-325 MG PO TABS
1.0000 | ORAL_TABLET | ORAL | Status: DC | PRN
  Administered 2016-12-29 – 2016-12-30 (×2): 2 via ORAL
  Filled 2016-12-29 (×3): qty 2

## 2016-12-29 MED ORDER — DEXTROSE 5 % IV SOLN
1.0000 g | INTRAVENOUS | Status: DC
  Administered 2016-12-29 – 2016-12-30 (×2): 1 g via INTRAVENOUS
  Filled 2016-12-29 (×3): qty 10

## 2016-12-29 MED ORDER — ADULT MULTIVITAMIN W/MINERALS CH
1.0000 | ORAL_TABLET | Freq: Every day | ORAL | Status: DC
  Administered 2016-12-29 – 2016-12-31 (×3): 1 via ORAL
  Filled 2016-12-29 (×3): qty 1

## 2016-12-29 MED ORDER — POLYETHYLENE GLYCOL 3350 17 G PO PACK
17.0000 g | PACK | Freq: Every day | ORAL | Status: DC | PRN
  Administered 2016-12-30: 17 g via ORAL
  Filled 2016-12-29: qty 1

## 2016-12-29 MED ORDER — DIPHENHYDRAMINE HCL 25 MG PO CAPS
25.0000 mg | ORAL_CAPSULE | Freq: Every evening | ORAL | Status: DC | PRN
  Administered 2016-12-29 – 2016-12-30 (×2): 25 mg via ORAL
  Filled 2016-12-29 (×2): qty 1

## 2016-12-29 MED ORDER — SODIUM CHLORIDE 0.9 % IV SOLN
INTRAVENOUS | Status: DC
  Administered 2016-12-29 – 2016-12-30 (×3): via INTRAVENOUS

## 2016-12-29 MED ORDER — FERROUS SULFATE 325 (65 FE) MG PO TABS
325.0000 mg | ORAL_TABLET | Freq: Every day | ORAL | Status: DC
  Administered 2016-12-29 – 2016-12-31 (×3): 325 mg via ORAL
  Filled 2016-12-29 (×3): qty 1

## 2016-12-29 MED ORDER — CALCIUM CARBONATE ANTACID 500 MG PO CHEW
1.0000 | CHEWABLE_TABLET | Freq: Three times a day (TID) | ORAL | Status: DC | PRN
  Administered 2016-12-29: 200 mg via ORAL
  Filled 2016-12-29: qty 1

## 2016-12-29 NOTE — H&P (Signed)
History and Physical    Tanya Little:[id number] DOB: Aug 25, 1932 DOA: 12/28/2016  PCP: Kaleen Mask, MD   Patient coming from: Home  Chief Complaint: Confusion   HPI: Tanya Little is a 83 y.o. female with medical history significant for atrial fibrillation Xarelto, essential tremor followed by neurology, GERD with Barrett esophagus, and hyperlipidemia who presents to the emergency department for evaluation of one of confusion. Patient is accompanied by her daughter who provides much of the history. Patient had reportedly been in her usual state of health until she was seemingly lethargic this afternoon and confused. She seemed to have persistent confusion throughout the day and then, at dinner time, seemed to have difficulty finding the right words to use. There had been no recent fall or trauma, no recent change in her medications, and she never experienced similar symptoms previously. Patient denies any headache, change in vision or hearing, or focal numbness or weakness.   ED Course: Upon arrival to the ED, patient is found to be afebrile, saturating adequately on room air, intermittently tachycardic, and with stable blood pressure. EKG features and atrial fibrillation with left axis deviation. Chest x-ray is negative for acute cardiopulmonary disease and noncontrast head CT is negative for acute intracranial abnormality. CMP is notable for a sodium of 124, CBC is unremarkable, troponin is undetectable, and urinalysis featured a positive nitrite with rare bacteria and only 0-5 WBC. Patient was given a liter of normal saline and empiric Rocephin for suspected UTI. There is concern for possible subtle left-sided weakness and neurology was consulted by the ED physician. Neurology advised obtaining MRI brain and medical admission for treatment of hyponatremia and suspected UTI. Patient remained intermittently tachycardic in the emergency department, but was stable blood pressure and no  respiratory distress. She will be admitted to the telemetry unit for ongoing evaluation and management of acute encephalopathy likely secondary to hyponatremia and/or UTI, but with acute CVA not entirely excluded.  Review of Systems:  All other systems reviewed and apart from HPI, are negative.  Past Medical History:  Diagnosis Date  . Atrial fibrillation (HCC)   . Barrett's esophagus   . Esophageal stricture   . Essential tremor   . GERD (gastroesophageal reflux disease)   . Hiatal hernia     Past Surgical History:  Procedure Laterality Date  . ABDOMINAL HYSTERECTOMY    . BALLOON DILATION N/A 09/06/2014   Procedure: BALLOON DILATION;  Surgeon: Louis Meckel, MD;  Location: [facility];  Service: Endoscopy;  Laterality: N/A;  . ESOPHAGOGASTRODUODENOSCOPY N/A 09/06/2014   Procedure: ESOPHAGOGASTRODUODENOSCOPY (EGD);  Surgeon: Louis Meckel, MD;  Location: [facility];  Service: Endoscopy;  Laterality: N/A;  . KNEE ARTHROSCOPY Right   . ROTATOR CUFF REPAIR Right      reports that she quit smoking about 12 years ago. Her smoking use included Cigarettes. She has never used smokeless tobacco. She reports that she does not drink alcohol or use drugs.  No Known Allergies  Family History  Problem Relation Age of Onset  . Diabetes Sister   . Lung cancer Brother   . Cancer Other     nephew  . Aneurysm Brother     x 3     Prior to Admission medications   Medication Sig Start Date End Date Taking? Authorizing Provider  CRANBERRY PO Take by mouth.   Yes Historical Provider, MD  diphenhydramine-acetaminophen (TYLENOL PM) 25-500 MG TABS Take 2 tablets by mouth at bedtime as needed.   Yes Historical  Provider, MD  ferrous sulfate 325 (65 FE) MG tablet Take 325 mg by mouth daily with breakfast.   Yes Historical Provider, MD  Multiple Vitamins-Minerals (CENTRUM SILVER PO) Take 1 tablet by mouth daily.   Yes Historical Provider, MD  NEXIUM 40 MG capsule Take 1 capsule (40 mg total)  by mouth daily before breakfast. 12/05/16  Yes Iva Boop, MD  Omega-3 Fatty Acids (FISH OIL) 1200 MG CAPS Take 1 capsule by mouth daily.   Yes Historical Provider, MD  PHENobarbital (LUMINAL) 64.8 MG tablet Take 64.8 mg by mouth 2 (two) times daily.   Yes Historical Provider, MD  rivaroxaban (XARELTO) 20 MG TABS tablet Take 20 mg by mouth daily with supper.   Yes Historical Provider, MD    Physical Exam: Vitals:   12/28/16 2311 12/29/16 0058 12/29/16 0259 12/29/16 0305  BP: 103/67 106/76  115/84  Pulse: 86 (!) 110  (!) 133  Resp: 15 16  16   Temp:    99.3 F (37.4 C)  TempSrc:    Oral  SpO2: 95% 95%  97%  Weight:   62.8 kg (138 lb 7.2 oz)   Height:   5' 7.5" (1.715 m)       Constitutional: NAD, calm, comfortable Eyes: PERTLA, lids and conjunctivae normal ENMT: Mucous membranes are moist. Posterior pharynx clear of any exudate or lesions.   Neck: normal, supple, no masses, no thyromegaly Respiratory: clear to auscultation bilaterally, no wheezing, no crackles. Normal respiratory effort.    Cardiovascular: Rate ~110 and irregular. No carotid bruits. No significant JVD. Abdomen: No distension, no tenderness, no masses palpated. Bowel sounds normal.  Musculoskeletal: no clubbing / cyanosis. No joint deformity upper and lower extremities. Normal muscle tone.  Skin: no significant rashes, lesions, ulcers. Warm, dry, well-perfused. Neurologic: CN 2-12 grossly intact. Sensation intact, DTR normal. Strength 5/5 in all 4 limbs. Tremulous.   Psychiatric: Alert and oriented x 3. Slow to respond, but oriented.     Labs on Admission: I have personally reviewed following labs and imaging studies  CBC:  Recent Labs Lab 12/28/16 1930 12/28/16 1945  WBC 9.1  --   NEUTROABS 5.1  --   HGB 12.4 12.6  HCT 34.7* 37.0  MCV 88.3  --   PLT 275  --    Basic Metabolic Panel:  Recent Labs Lab 12/28/16 1930 12/28/16 1945  NA 124* 127*  K 4.0 4.1  CL 94* 94*  CO2 22  --   GLUCOSE  114* 108*  BUN 16 16  CREATININE 0.80 0.80  CALCIUM 9.1  --    GFR: Estimated Creatinine Clearance: 52.8 mL/min (by C-G formula based on SCr of 0.8 mg/dL). Liver Function Tests:  Recent Labs Lab 12/28/16 1930  AST 21  ALT 21  ALKPHOS 56  BILITOT 0.5  PROT 7.0  ALBUMIN 4.0   No results for input(s): LIPASE, AMYLASE in the last 168 hours.  Recent Labs Lab 12/28/16 1931  AMMONIA 15   Coagulation Profile: No results for input(s): INR, PROTIME in the last 168 hours. Cardiac Enzymes: No results for input(s): CKTOTAL, CKMB, CKMBINDEX, TROPONINI in the last 168 hours. BNP (last 3 results) No results for input(s): PROBNP in the last 8760 hours. HbA1C: No results for input(s): HGBA1C in the last 72 hours. CBG: No results for input(s): GLUCAP in the last 168 hours. Lipid Profile: No results for input(s): CHOL, HDL, LDLCALC, TRIG, CHOLHDL, LDLDIRECT in the last 72 hours. Thyroid Function Tests: No results for input(s): TSH,  T4TOTAL, FREET4, T3FREE, THYROIDAB in the last 72 hours. Anemia Panel: No results for input(s): VITAMINB12, FOLATE, FERRITIN, TIBC, IRON, RETICCTPCT in the last 72 hours. Urine analysis:    Component Value Date/Time   COLORURINE YELLOW 12/28/2016 2049   APPEARANCEUR HAZY (A) 12/28/2016 2049   LABSPEC 1.011 12/28/2016 2049   PHURINE 6.0 12/28/2016 2049   GLUCOSEU NEGATIVE 12/28/2016 2049   HGBUR NEGATIVE 12/28/2016 2049   BILIRUBINUR NEGATIVE 12/28/2016 2049   KETONESUR 5 (A) 12/28/2016 2049   PROTEINUR NEGATIVE 12/28/2016 2049   NITRITE POSITIVE (A) 12/28/2016 2049   LEUKOCYTESUR NEGATIVE 12/28/2016 2049   Sepsis Labs: @LABRCNTIP (procalcitonin:4,lacticidven:4) )No results found for this or any previous visit (from the past 240 hour(s)).   Radiological Exams on Admission: Dg Chest 2 View  Result Date: 12/28/2016 CLINICAL DATA:  83 y/o  F; chest pain. EXAM: CHEST  2 VIEW COMPARISON:  None. FINDINGS: The heart size and mediastinal contours are  within normal limits. Aortic atherosclerosis with calcification. Chronic right lateral rib fracture deformities. No focal consolidation, effusion, or pneumothorax identified. Minor biapical pleuroparenchymal scarring. IMPRESSION: No active cardiopulmonary disease. Electronically Signed   By: Mitzi Hansen M.D.   On: 12/28/2016 19:58   Ct Head Wo Contrast  Result Date: 12/28/2016 CLINICAL DATA:  Altered mental status. EXAM: CT HEAD WITHOUT CONTRAST TECHNIQUE: Contiguous axial images were obtained from the base of the skull through the vertex without intravenous contrast. COMPARISON:  Brain MRI 10/03/2012 FINDINGS: Brain: There is no evidence of acute cortical infarct, intracranial hemorrhage, mass, midline shift, or extra-axial fluid collection. A mega cisterna magna is incidentally noted. T2 hyperintensities in the deep cerebral white matter and basal ganglia bilaterally correspond to a combination of chronic small vessel ischemic disease/chronic lacunar infarcts and dilated perivascular spaces on the prior MRI. Mild cerebral atrophy is within normal limits for age. Vascular: No hyperdense vessel or unexpected calcification. Skull: No fracture or focal osseous lesion. Sinuses/Orbits: Visualized paranasal sinuses and mastoid air cells are clear. No evidence of acute orbital abnormality. Other: None. IMPRESSION: 1. No evidence of acute intracranial abnormality. 2. Mild chronic small vessel ischemic disease. Electronically Signed   By: Sebastian Ache M.D.   On: 12/28/2016 20:07    EKG: Independently reviewed. Atrial fibrillation, LAD.   Assessment/Plan  1. Acute encephalopathy  - Head CT negative, pt with severe chronic tremor followed by neuro, but no definite focal findings  - Hyponatremia to 124 and nitrite-positive urine noted; will address as below; check phenobarbital level - Neurology consulting and much appreciated, MRI pending   2. Hyponatremia  - Serum sodium 124 on admission; no  priors available; family has not heard of her having low sodium previously  - Appears roughly euvolemic on admission  - Check osmolality, urine studies, TSH, and cortisol  - She was given 1 liter NS in ED and is continued on gentle hydration with NS  - Follow serial chem panels to avoid over-rapid correction   3. UTI  - Urine sent for culture, no old cultures available   - Treated with empiric Rocephin, will continue with culture pending    4. Atrial fibrillation with RVR  - In atrial fibrillation with intermittent RVR  - She was given diltiazem IVP 15 mg x1 and is monitored on telemetry  - CHADS-VASc at least 3 (age x2, gender) - Continue Xarelto    5. Essential tremor  - Followed by neurology at [facility] and under eval for possible deep-brain stimulator  - Propanalol was ineffective and she  is managed with phenobarbital, will continue, level is pending as above     DVT prophylaxis: Xarelto   Code Status: Full  Family Communication: Discussed with daughter at bedside Disposition Plan: Admit to telemetry Consults called: Neuro  Admission status: Inpatient    Briscoe Deutscherimothy S Brittaney Beaulieu, MD [facility] Pager [phone]  If 7PM-7AM, please contact night-coverage [email or web address] Password [email or web address]  12/29/2016, 3:37 AM

## 2016-12-29 NOTE — Progress Notes (Addendum)
PROGRESS NOTE                                                                                                                                                                                                             Patient Demographics:    Tanya Little, is a 83 y.o. female, DOB - 09/29/1932, ZOX:[id number]  Admit date - 12/28/2016   Admitting Physician Briscoe Deutscher, MD  Outpatient Primary MD for the patient is Kaleen Mask, MD  LOS - 1   Chief Complaint  Patient presents with  . Headache  . Aphasia  . Altered Mental Status       Brief Narrative   83 y.o. female with medical history significant for atrial fibrillation Xarelto, essential tremor followed by neurology, GERD with Barrett esophagus, and hyperlipidemia who presents to the emergency department for evaluation of one of confusion, no acute findings in CT head, workup significant for dehydration, hyponatremia and UTI.   Subjective:    [place] Scrape today has, No headache, No chest pain, No abdominal pain - No Nausea, Report generalized weakness  Assessment  & Plan :    Principal Problem:   Acute encephalopathy Active Problems:   Essential tremor   Hyponatremia   Atrial fibrillation with RVR (HCC)   Acute lower UTI   Left-sided weakness   Acute encephalopathy  - Head CT negative, pt with severe chronic tremor followed by neuro, but no definite focal findings , Has been improving, but remains confused, not back to baseline as per daughter at bedside, encephalopathy most likely might 2 factorial in the setting of UTI, hyponatremia and dehydration. - MRI brain pending - Follow on phenobarbital level  Hyponatremia  - Serum sodium 124 on admission, appears to be with clinical dehydration, continue with IV normal saline, recheck in a.m. appears to be improving - Check osmolality, urine studies, TSH, and cortisol   UTI  - Urine sent for culture, no old cultures available   -  Report she went for PCP on Thursday for dysuria. - Treated with empiric Rocephin, will continue with culture pending    Atrial fibrillation with RVR  - In atrial fibrillation with intermittent RVR , required diltiazem IVP 15 mg x1 and is monitored on telemetry , currently appears to be controlled, - CHADS-VASc at least 23 (age x2, gender), Continue Xarelto  Essential tremor  - Followed by neurology at [facility] and under eval for possible deep-brain stimulator  - Propanalol was ineffective and she is managed with phenobarbital, will continue, level is pending as above      Code Status : Full  Family Communication  : Daughter at bedside  Disposition Plan  : Home when stable  Consults  :  None  DVT Prophylaxis  :  Xarelto  Lab Results  Component Value Date   PLT 275 12/28/2016    Antibiotics  :    Anti-infectives    Start     Dose/Rate Route Frequency Ordered Stop   12/29/16 2200  cefTRIAXone (ROCEPHIN) 1 g in dextrose 5 % 50 mL IVPB     1 g 100 mL/hr over 30 Minutes Intravenous Every 24 hours 12/29/16 0423     12/28/16 2230  cefTRIAXone (ROCEPHIN) 1 g in dextrose 5 % 50 mL IVPB     1 g 100 mL/hr over 30 Minutes Intravenous  Once 12/28/16 2215 12/28/16 2350        Objective:   Vitals:   12/28/16 2311 12/29/16 0058 12/29/16 0259 12/29/16 0305  BP: 103/67 106/76  115/84  Pulse: 86 (!) 110  (!) 133  Resp: 15 16  16   Temp:    99.3 F (37.4 C)  TempSrc:    Oral  SpO2: 95% 95%  97%  Weight:   62.8 kg (138 lb 7.2 oz)   Height:   5' 7.5" (1.715 m)     Wt Readings from Last 3 Encounters:  12/29/16 62.8 kg (138 lb 7.2 oz)  12/29/16 62.6 kg (138 lb)  12/05/16 62.1 kg (137 lb)     Intake/Output Summary (Last 24 hours) at 12/29/16 1045 Last data filed at 12/29/16 0600  Gross per 24 hour  Intake                0 ml  Output                0 ml  Net                0 ml     Physical Exam  Awake Alert, Pleasant, mildly confused Supple Neck,No JVD, Symmetrical  Chest wall movement, Good air movement bilaterally, CTAB RRR,No Gallops,Rubs or new Murmurs, No Parasternal Heave +ve B.Sounds, Abd Soft, No tenderness, No rebound - guarding or rigidity. No Cyanosis, Clubbing or edema.    Data Review:    CBC  Recent Labs Lab 12/28/16 1930 12/28/16 1945  WBC 9.1  --   HGB 12.4 12.6  HCT 34.7* 37.0  PLT 275  --   MCV 88.3  --   MCH 31.6  --   MCHC 35.7  --   RDW 11.9  --   LYMPHSABS 2.9  --   MONOABS 0.8  --   EOSABS 0.3  --   BASOSABS 0.0  --     Chemistries   Recent Labs Lab 12/28/16 1930 12/28/16 1945 12/29/16 0549  NA 124* 127* 127*  K 4.0 4.1 3.6  CL 94* 94* 98*  CO2 22  --  20*  GLUCOSE 114* 108* 109*  BUN 16 16 14   CREATININE 0.80 0.80 0.76  CALCIUM 9.1  --  8.1*  AST 21  --   --   ALT 21  --   --   ALKPHOS 56  --   --   BILITOT 0.5  --   --    ------------------------------------------------------------------------------------------------------------------  No results for input(s): CHOL, HDL, LDLCALC, TRIG, CHOLHDL, LDLDIRECT in the last 72 hours.  No results found for: HGBA1C ------------------------------------------------------------------------------------------------------------------  Recent Labs  12/29/16 0549  TSH 1.345   ------------------------------------------------------------------------------------------------------------------ No results for input(s): VITAMINB12, FOLATE, FERRITIN, TIBC, IRON, RETICCTPCT in the last 72 hours.  Coagulation profile No results for input(s): INR, PROTIME in the last 168 hours.  No results for input(s): DDIMER in the last 72 hours.  Cardiac Enzymes No results for input(s): CKMB, TROPONINI, MYOGLOBIN in the last 168 hours.  Invalid input(s): CK ------------------------------------------------------------------------------------------------------------------ No results found for: BNP  Inpatient Medications  Scheduled Meds: . cefTRIAXone (ROCEPHIN) IVPB 1  gram/50 mL D5W  1 g Intravenous Q24H  . feeding supplement (ENSURE ENLIVE)  237 mL Oral BID BM  . ferrous sulfate  325 mg Oral Q breakfast  . multivitamin with minerals  1 tablet Oral Daily  . omega-3 acid ethyl esters  1 g Oral Daily  . pantoprazole  40 mg Oral Daily  . PHENobarbital  64.8 mg Oral BID  . rivaroxaban  20 mg Oral Q supper   Continuous Infusions: . sodium chloride 75 mL/hr at 12/29/16 0917   PRN Meds:.acetaminophen **OR** acetaminophen, bisacodyl, HYDROcodone-acetaminophen, ondansetron **OR** ondansetron (ZOFRAN) IV, polyethylene glycol  Micro Results No results found for this or any previous visit (from the past 240 hour(s)).  Radiology Reports Dg Chest 2 View  Result Date: 12/28/2016 CLINICAL DATA:  83 y/o  F; chest pain. EXAM: CHEST  2 VIEW COMPARISON:  None. FINDINGS: The heart size and mediastinal contours are within normal limits. Aortic atherosclerosis with calcification. Chronic right lateral rib fracture deformities. No focal consolidation, effusion, or pneumothorax identified. Minor biapical pleuroparenchymal scarring. IMPRESSION: No active cardiopulmonary disease. Electronically Signed   By: Mitzi HansenLance  Furusawa-Stratton M.D.   On: 12/28/2016 19:58   Ct Head Wo Contrast  Result Date: 12/28/2016 CLINICAL DATA:  Altered mental status. EXAM: CT HEAD WITHOUT CONTRAST TECHNIQUE: Contiguous axial images were obtained from the base of the skull through the vertex without intravenous contrast. COMPARISON:  Brain MRI 10/03/2012 FINDINGS: Brain: There is no evidence of acute cortical infarct, intracranial hemorrhage, mass, midline shift, or extra-axial fluid collection. A mega cisterna magna is incidentally noted. T2 hyperintensities in the deep cerebral white matter and basal ganglia bilaterally correspond to a combination of chronic small vessel ischemic disease/chronic lacunar infarcts and dilated perivascular spaces on the prior MRI. Mild cerebral atrophy is within normal  limits for age. Vascular: No hyperdense vessel or unexpected calcification. Skull: No fracture or focal osseous lesion. Sinuses/Orbits: Visualized paranasal sinuses and mastoid air cells are clear. No evidence of acute orbital abnormality. Other: None. IMPRESSION: 1. No evidence of acute intracranial abnormality. 2. Mild chronic small vessel ischemic disease. Electronically Signed   By: Sebastian AcheAllen  Grady M.D.   On: 12/28/2016 20:07     Randol KernELGERGAWY, DAWOOD M.D on 12/29/2016 at 10:45 AM  Between 7am to 7pm - Pager - [phone]  After 7pm go to [email or web address] - password [email or web address]  [facility] -  Office  [phone]

## 2016-12-29 NOTE — Progress Notes (Signed)
PHARMACY NOTE -  ANTIBIOTIC RENAL DOSE ADJUSTMENT   Request received for Pharmacy to assist with antibiotic renal dose adjustment.  Patient has been initiated on Ceftriaxone 1gm iv q24hr for UTI. SCr 0.8, estimated CrCl ~55 ml/min Current dosage is appropriate and need for further dosage adjustment appears unlikely at present. Will sign off at this time.  Please reconsult if a change in clinical status warrants re-evaluation of dosage.

## 2016-12-30 LAB — CBC
HEMATOCRIT: 29.1 % — AB (ref 36.0–46.0)
Hemoglobin: 10.4 g/dL — ABNORMAL LOW (ref 12.0–15.0)
MCH: 32.3 pg (ref 26.0–34.0)
MCHC: 35.7 g/dL (ref 30.0–36.0)
MCV: 90.4 fL (ref 78.0–100.0)
Platelets: 209 10*3/uL (ref 150–400)
RBC: 3.22 MIL/uL — ABNORMAL LOW (ref 3.87–5.11)
RDW: 12.1 % (ref 11.5–15.5)
WBC: 7.7 10*3/uL (ref 4.0–10.5)

## 2016-12-30 LAB — BASIC METABOLIC PANEL
Anion gap: 8 (ref 5–15)
BUN: 10 mg/dL (ref 6–20)
CALCIUM: 8.2 mg/dL — AB (ref 8.9–10.3)
CO2: 20 mmol/L — AB (ref 22–32)
Chloride: 100 mmol/L — ABNORMAL LOW (ref 101–111)
Creatinine, Ser: 0.72 mg/dL (ref 0.44–1.00)
GFR calc Af Amer: >60 mL/min (ref >60)
GLUCOSE: 96 mg/dL (ref 65–99)
POTASSIUM: 3.5 mmol/L (ref 3.5–5.1)
Sodium: 128 mmol/L — ABNORMAL LOW (ref 135–145)

## 2016-12-30 LAB — PHENOBARBITAL LEVEL: Phenobarbital: <5 ug/mL — ABNORMAL LOW (ref 15.0–30.0)

## 2016-12-30 LAB — GLUCOSE, CAPILLARY: Glucose-Capillary: 115 mg/dL — ABNORMAL HIGH (ref 65–99)

## 2016-12-30 MED ORDER — PROPRANOLOL HCL 10 MG PO TABS
20.0000 mg | ORAL_TABLET | Freq: Two times a day (BID) | ORAL | Status: DC
  Administered 2016-12-30 – 2016-12-31 (×3): 20 mg via ORAL
  Filled 2016-12-30 (×5): qty 2

## 2016-12-30 NOTE — NC FL2 (Signed)
[place] MEDICAID FL2 LEVEL OF CARE SCREENING TOOL     IDENTIFICATION  Patient Name: Tanya Little Birthdate: Mar 09, 1932 Sex: female Admission Date (Current Location): 12/28/2016  [facility] and [place] Number:  Producer, television/film/video and Address:  [facility] [place], [address], [place], [place] [address]      Provider Number: [id number]  Attending Physician Name and Address:  Starleen Arms, MD  Relative Name and Phone Number:       Current Level of Care: Hospital Recommended Level of Care: Skilled Nursing Facility Prior Approval Number:    Date Approved/Denied:   PASRR Number: [id number]  Discharge Plan: SNF    Current Diagnoses: Patient Active Problem List   Diagnosis Date Noted  . Acute encephalopathy 12/28/2016  . Hyponatremia 12/28/2016  . Atrial fibrillation with RVR (HCC) 12/28/2016  . Acute lower UTI 12/28/2016  . Left-sided weakness 12/28/2016  . Esophageal stricture 09/06/2014  . Dysphagia, pharyngoesophageal phase 08/26/2014  . Encounter for colorectal cancer screening 08/26/2014  . Essential tremor 08/26/2014    Orientation RESPIRATION BLADDER Height & Weight     Self, Time, Situation, Place  Normal Continent Weight: 137 lb 9.1 oz (62.4 kg) Height:  5' 7.5" (171.5 cm)  BEHAVIORAL SYMPTOMS/MOOD NEUROLOGICAL BOWEL NUTRITION STATUS      Continent Diet (see DC summary)  AMBULATORY STATUS COMMUNICATION OF NEEDS Skin   Limited Assist Verbally Normal                       Personal Care Assistance Level of Assistance  Bathing, Dressing Bathing Assistance: Limited assistance   Dressing Assistance: Limited assistance     Functional Limitations Info             SPECIAL CARE FACTORS FREQUENCY  PT (By licensed PT), OT (By licensed OT)     PT Frequency: 5/wk OT Frequency: 5/wk            Contractures      Additional Factors Info  Code Status, Allergies Code Status Info: FULL Allergies Info: NKA            Current Medications (12/30/2016):  This is the current hospital active medication list Current Facility-Administered Medications  Medication Dose Route Frequency Provider Last Rate Last Dose  . 0.9 %  sodium chloride infusion   Intravenous Continuous Starleen Arms, MD 75 mL/hr at 12/30/16 1239    . acetaminophen (TYLENOL) tablet 650 mg  650 mg Oral Q6H PRN Briscoe Deutscher, MD   650 mg at 12/29/16 2101   Or  . acetaminophen (TYLENOL) suppository 650 mg  650 mg Rectal Q6H PRN Briscoe Deutscher, MD      . bisacodyl (DULCOLAX) EC tablet 5 mg  5 mg Oral Daily PRN Briscoe Deutscher, MD   5 mg at 12/30/16 0852  . calcium carbonate (TUMS - dosed in mg elemental calcium) chewable tablet 200 mg of elemental calcium  1 tablet Oral TID PRN Starleen Arms, MD   200 mg of elemental calcium at 12/29/16 1218  . cefTRIAXone (ROCEPHIN) 1 g in dextrose 5 % 50 mL IVPB  1 g Intravenous Q24H Briscoe Deutscher, MD   1 g at 12/29/16 2113  . diphenhydrAMINE (BENADRYL) capsule 25 mg  25 mg Oral QHS PRN Starleen Arms, MD   25 mg at 12/29/16 2101  . feeding supplement (ENSURE ENLIVE) (ENSURE ENLIVE) liquid 237 mL  237 mL Oral BID BM Lavone Neri  Opyd, MD   237 mL at 12/30/16 1239  . ferrous sulfate tablet 325 mg  325 mg Oral Q breakfast Briscoe Deutscherimothy S Opyd, MD   325 mg at 12/30/16 0844  . HYDROcodone-acetaminophen (NORCO/VICODIN) 5-325 MG per tablet 1-2 tablet  1-2 tablet Oral Q4H PRN Briscoe Deutscherimothy S Opyd, MD   2 tablet at 12/29/16 2355  . multivitamin with minerals tablet 1 tablet  1 tablet Oral Daily Briscoe Deutscherimothy S Opyd, MD   1 tablet at 12/30/16 0844  . omega-3 acid ethyl esters (LOVAZA) capsule 1 g  1 g Oral Daily Briscoe Deutscherimothy S Opyd, MD   1 g at 12/30/16 0844  . ondansetron (ZOFRAN) tablet 4 mg  4 mg Oral Q6H PRN Briscoe Deutscherimothy S Opyd, MD       Or  . ondansetron (ZOFRAN) injection 4 mg  4 mg Intravenous Q6H PRN Briscoe Deutscherimothy S Opyd, MD      . pantoprazole (PROTONIX) EC tablet 40 mg  40 mg Oral Daily Briscoe Deutscherimothy S Opyd, MD   40 mg at 12/30/16 0844  .  polyethylene glycol (MIRALAX / GLYCOLAX) packet 17 g  17 g Oral Daily PRN Briscoe Deutscherimothy S Opyd, MD      . propranolol (INDERAL) tablet 20 mg  20 mg Oral BID Starleen Armsawood S Elgergawy, MD   20 mg at 12/30/16 1239  . rivaroxaban (XARELTO) tablet 20 mg  20 mg Oral Q supper Briscoe Deutscherimothy S Opyd, MD   20 mg at 12/29/16 1653     Discharge Medications: Please see discharge summary for a list of discharge medications.  Relevant Imaging Results:  Relevant Lab Results:   Additional Information SS#: [id number]  Burna SisUris, Shamecka Hocutt H, LCSW

## 2016-12-30 NOTE — Evaluation (Signed)
Physical Therapy Evaluation Patient Details Name: Tanya Little MRN: [id number] DOB: [birth year] Today's Date: 12/30/2016   History of Present Illness  83 y.o. female with medical history significant for atrial fibrillation Xarelto, essential tremor followed by neurology, GERD with Barrett esophagus, and hyperlipidemia who presents to the emergency department for evaluation of one of confusion, no acute findings in CT head, workup significant for dehydration, hyponatremia and UTI.  Clinical Impression  Pt admitted with above diagnosis. Pt currently with functional limitations due to the deficits listed below (see PT Problem List).  Pt will benefit from skilled PT to increase their independence and safety with mobility to allow discharge to the venue listed below.  Pt c/o numbess/tingling in toes and top of feet.  Prior to hospitalization, pt has been independently living at home.  Today she was able to to ambulate 20' with RW with min/mod A due to tremors and weakness.  Pt was quite fatigued after gait.  Per daughter, pt is scheduled for deep brain stimulator in May with her pre-op mid April.  Pt needs to be as strong as possible for this procedure.  Recommend short term SNF to work on strengthening and maximizing her independence and safety prior to returning home, as well as preparing for her upcoming procedure.  Pt has supportive family, but they work and are unable to A pt as much as she currently needs.    Follow Up Recommendations SNF;Supervision/Assistance - 24 hour ([facility] if possible)    Equipment Recommendations  None recommended by PT    Recommendations for Other Services       Precautions / Restrictions Precautions Precautions: Fall Precaution Comments: tremors Restrictions Weight Bearing Restrictions: No      Mobility  Bed Mobility               General bed mobility comments: pt up in chair   Transfers Overall transfer level: Needs assistance Equipment used: Rolling  walker (2 wheeled) Transfers: Sit to/from Stand Sit to Stand: Min assist         General transfer comment: MIN A to power up  Ambulation/Gait Ambulation/Gait assistance: Min assist;Mod assist Ambulation Distance (Feet): 20 Feet Assistive device: Rolling walker (2 wheeled) Gait Pattern/deviations: Decreased step length - right;Decreased step length - left;Trunk flexed     General Gait Details: Due to tremor, RW coming off the floor at times with gait with decreased step length. Pt fatigued with gait and tearful afterwards  Stairs            Wheelchair Mobility    Modified Rankin (Stroke Patients Only)       Balance Overall balance assessment: Needs assistance   Sitting balance-Leahy Scale: Fair       Standing balance-Leahy Scale: Poor Standing balance comment: requires UE support                              Pertinent Vitals/Pain Pain Assessment: Faces Faces Pain Scale: Hurts even more Pain Location: top of foot and toes Pain Descriptors / Indicators: Numbness;Tingling Pain Intervention(s): Limited activity within patient's tolerance;Monitored during session    Home Living Family/patient expects to be discharged to:: Private residence Living Arrangements: Alone Available Help at Discharge: Available PRN/intermittently Type of Home: Mobile home Home Access: Stairs to enter   Entrance Stairs-Number of Steps: 4 Home Layout: One level Home Equipment: Walker - 2 wheels;Cane - single point      Prior Function Level of  Independence: Independent         Comments: 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 and has had decline since then     Hand Dominance        Extremity/Trunk Assessment   Upper Extremity Assessment Upper Extremity Assessment:  (tremors noted)    Lower Extremity Assessment Lower Extremity Assessment: Generalized weakness    Cervical / Trunk Assessment Cervical / Trunk Assessment: Normal  Communication    Communication: HOH  Cognition Arousal/Alertness: Awake/alert Behavior During Therapy: WFL for tasks assessed/performed Overall Cognitive Status: Within Functional Limits for tasks assessed                 General Comments: Pt tearful after gait    General Comments      Exercises     Assessment/Plan    PT Assessment Patient needs continued PT services  PT Problem List Decreased strength;Decreased activity tolerance;Decreased balance;Decreased mobility       PT Treatment Interventions DME instruction;Gait training;Functional mobility training;Therapeutic activities;Therapeutic exercise;Balance training    PT Goals (Current goals can be found in the Care Plan section)  Acute Rehab PT Goals Patient Stated Goal: to return to PLOF at home if possible, daughter is hoping for rehab PT Goal Formulation: With patient/family Time For Goal Achievement: 01/06/17 Potential to Achieve Goals: Good    Frequency Min 3X/week   Barriers to discharge Decreased caregiver support lives alone    Co-evaluation               End of Session Equipment Utilized During Treatment: Gait belt Activity Tolerance: Patient limited by fatigue Patient left: in chair;with call bell/phone within reach;with family/visitor present   PT Visit Diagnosis: Unsteadiness on feet (R26.81);Other abnormalities of gait and mobility (R26.89)         Time: 4098-1191 PT Time Calculation (min) (ACUTE ONLY): 23 min   Charges:   PT Evaluation $PT Eval Moderate Complexity: 1 Procedure PT Treatments $Gait Training: 8-22 mins   PT G Codes:         Teri Legacy LUBECK 12/30/2016, 1:08 PM

## 2016-12-30 NOTE — Progress Notes (Signed)
Nutrition Brief Note  Patient identified on the Malnutrition Screening Tool (MST) Report  Wt Readings from Last 15 Encounters:  12/30/16 137 lb 9.1 oz (62.4 kg)  12/29/16 138 lb (62.6 kg)  12/05/16 137 lb (62.1 kg)  11/26/16 137 lb (62.1 kg)  09/06/14 136 lb (61.7 kg)  08/27/14 138 lb (62.6 kg)  08/26/14 138 lb (62.6 kg)    Body mass index is 21.23 kg/m. Patient meets criteria for normal based on current BMI.   Current diet order is Heart Healthy, patient is consuming approximately 50% of meals at this time. Labs and medications reviewed.   No nutrition interventions warranted at this time. If nutrition issues arise, please consult RD.   Tilda FrancoLindsey Escarlet Saathoff, MS, RD, LDN Pager: [phone] After Hours Pager: [phone]

## 2016-12-30 NOTE — Clinical Social Work Note (Signed)
Clinical Social Work Assessment  Patient Details  Name: Tanya Little MRN: [id number] Date of Birth: May 01, 1932  Date of referral:  12/30/16               Reason for consult:  Facility Placement                Permission sought to share information with:  Family Supports Permission granted to share information::  Yes, Verbal Permission Granted  Name::     Designer, fashion/clothingDebra  Agency::  SNF  Relationship::  dtr  Contact Information:     Housing/Transportation Living arrangements for the past 2 months:  Single Family Home Source of Information:  Patient Patient Interpreter Needed:  None Criminal Activity/Legal Involvement Pertinent to Current Situation/Hospitalization:  No - Comment as needed Significant Relationships:  Adult Children Lives with:  Self Do you feel safe going back to the place where you live?  No Need for family participation in patient care:  No (Coment)  Care giving concerns: Pt lives at home alone and children work and unable to be with pt 24 hours a day.  Pt with significant decline in mobility.   Social Worker assessment / plan:  CSW spoke with pt and pt dtr at bedside concerning PT recommendation for SNF.  CSW explained SNF and SNF referral process.  Employment status:  Retired Health and safety inspectornsurance information:  Medicare PT Recommendations:  Skilled Nursing Facility Information / Referral to community resources:  Skilled Nursing Facility  Patient/Family's Response to care:  Pt is agreeable to SNF if it is necessary- hopeful for [facility] which is close to home.  Patient/Family's Understanding of and Emotional Response to Diagnosis, Current Treatment, and Prognosis:  No questions or concerns at this time- pt seems somewhat frustrated with current situation.  Emotional Assessment Appearance:  Appears stated age Attitude/Demeanor/Rapport:    Affect (typically observed):  Appropriate Orientation:  Oriented to Self, Oriented to Place, Oriented to  Time, Oriented to Situation Alcohol  / Substance use:  Not Applicable Psych involvement (Current and /or in the community):  No (Comment)  Discharge Needs  Concerns to be addressed:  Care Coordination Readmission within the last 30 days:  No Current discharge risk:  Physical Impairment Barriers to Discharge:  Continued Medical Work up   Burna SisUris, Jermine Bibbee H, LCSW 12/30/2016, 3:04 PM

## 2016-12-30 NOTE — Progress Notes (Signed)
PROGRESS NOTE                                                                                                                                                                                                             Patient Demographics:    Tanya Little, is a 83 y.o. female, DOB - [birth year], AVW:[id number]  Admit date - 12/28/2016   Admitting Physician Briscoe Deutscherimothy S Opyd, MD  Outpatient Primary MD for the patient is Tanya MaskELKINS,WILSON OLIVER, MD  LOS - 2   Chief Complaint  Patient presents with  . Headache  . Aphasia  . Altered Mental Status       Brief Narrative   83 y.o. female with medical history significant for atrial fibrillation Xarelto, essential tremor followed by neurology, GERD with Barrett esophagus, and hyperlipidemia who presents to the emergency department for evaluation of one of confusion, no acute findings in CT head, workup significant for dehydration, hyponatremia and UTI.   Subjective:    Tanya Little today has, No headache, No chest pain, No abdominal pain - No Nausea, Report generalized weakness  Assessment  & Plan :    Principal Problem:   Acute encephalopathy Active Problems:   Essential tremor   Hyponatremia   Atrial fibrillation with RVR (HCC)   Acute lower UTI   Left-sided weakness   Acute encephalopathy  - Head CT negative, pt with severe chronic tremor followed by neuro, but no definite focal findings , Has been improving, but remains confused, not back to baseline as per daughter at bedside, encephalopathy most likely might 2 factorial in the setting of UTI, hyponatremia and dehydration. - MRI brain No evidence of acute CVA -  Phenobarbital level is undetectable, shunt herself is unclear if she is supposed to be taken phenobarbital or propranolol,D/W  daughter, they will go home and check her medication bottles at home.  Hyponatremia  - Serum sodium 124 on admission, appears to be with clinical dehydration,  continue with IV normal saline, used to be improving, is 128 today, but she still appears to be dehydrated, so we'll increase IV fluid to 75 mL/h .recheck in a.m. appears to be improving - TSH within normal limit  UTI  - Follow on urine cultures , no old cultures available   - Report she went for PCP on Thursday for dysuria. - Treated with  empiric Rocephin, will continue with culture pending    Atrial fibrillation with RVR  - In atrial fibrillation with intermittent RVR , required diltiazem IVP 15 mg x1 and is monitored on telemetry , currently appears to be controlled, - CHADS-VASc at least 60 (age x2, gender), Continue Xarelto    Essential tremor  - Followed by [facility]y at [facility] and under eval for possible deep-brain stimulator  - unclear if he is taking propranolol or phenobarbital, family will check on medication and notify us    Code Status : Full  Family Communication  : Daughter via phone 3/18  Disposition Plan  : Home when stable, hopefully in 1-2 days , ending urine cultures, and if urine sodium keeps to improve  Consults  :  None  DVT Prophylaxis  :  Xarelto  Lab Results  Component Value Date   PLT 209 12/30/2016    Antibiotics  :    Anti-infectives    Start     Dose/Rate Route Frequency Ordered Stop   12/29/16 2200  cefTRIAXone (ROCEPHIN) 1 g in dextrose 5 % 50 mL IVPB     1 g 100 mL/hr over 30 Minutes Intravenous Every 24 hours 12/29/16 0423     12/28/16 2230  cefTRIAXone (ROCEPHIN) 1 g in dextrose 5 % 50 mL IVPB     1 g 100 mL/hr over 30 Minutes Intravenous  Once 12/28/16 2215 12/28/16 2350        Objective:   Vitals:   12/29/16 1406 12/29/16 1945 12/30/16 0500 12/30/16 0543  BP: 119/79 126/75  109/71  Pulse: 73 86  80  Resp: 16 17  17   Temp: 99.1 F (37.3 C) 98.8 F (37.1 C)  98.2 F (36.8 C)  TempSrc: Oral Oral  Oral  SpO2: 98% 96%  94%  Weight:   62.4 kg (137 lb 9.1 oz)   Height:        Wt Readings from Last 3 Encounters:  12/30/16 62.4  kg (137 lb 9.1 oz)  12/29/16 62.6 kg (138 lb)  12/05/16 62.1 kg (137 lb)     Intake/Output Summary (Last 24 hours) at 12/30/16 1110 Last data filed at 12/30/16 1056  Gross per 24 hour  Intake             1920 ml  Output             1800 ml  Net              120 ml     Physical Exam  Awake Alert, Pleasant, mildly confused Supple Neck,No JVD, Symmetrical Chest wall movement, Good air movement bilaterally, CTAB RRR,No Gallops,Rubs or new Murmurs, No Parasternal Heave +ve B.Sounds, Abd Soft, No tenderness, No rebound - guarding or rigidity. No Cyanosis, Clubbing or edema.    Data Review:    CBC  Recent Labs Lab 12/28/16 1930 12/28/16 1945 12/30/16 0329  WBC 9.1  --  7.7  HGB 12.4 12.6 10.4*  HCT 34.7* 37.0 29.1*  PLT 275  --  209  MCV 88.3  --  90.4  MCH 31.6  --  32.3  MCHC 35.7  --  35.7  RDW 11.9  --  12.1  LYMPHSABS 2.9  --   --   MONOABS 0.8  --   --   EOSABS 0.3  --   --   BASOSABS 0.0  --   --     Chemistries   Recent Labs Lab 12/28/16 1930 12/28/16 1945 12/29/16 0549 12/30/16  0329  NA 124* 127* 127* 128*  K 4.0 4.1 3.6 3.5  CL 94* 94* 98* 100*  CO2 22  --  20* 20*  GLUCOSE 114* 108* 109* 96  BUN 16 16 14 10   CREATININE 0.80 0.80 0.76 0.72  CALCIUM 9.1  --  8.1* 8.2*  AST 21  --   --   --   ALT 21  --   --   --   ALKPHOS 56  --   --   --   BILITOT 0.5  --   --   --    ------------------------------------------------------------------------------------------------------------------ No results for input(s): CHOL, HDL, LDLCALC, TRIG, CHOLHDL, LDLDIRECT in the last 72 hours.  No results found for: HGBA1C ------------------------------------------------------------------------------------------------------------------  Recent Labs  12/29/16 0549  TSH 1.345   ------------------------------------------------------------------------------------------------------------------ No results for input(s): VITAMINB12, FOLATE, FERRITIN, TIBC, IRON,  RETICCTPCT in the last 72 hours.  Coagulation profile No results for input(s): INR, PROTIME in the last 168 hours.  No results for input(s): DDIMER in the last 72 hours.  Cardiac Enzymes No results for input(s): CKMB, TROPONINI, MYOGLOBIN in the last 168 hours.  Invalid input(s): CK ------------------------------------------------------------------------------------------------------------------ No results found for: BNP  Inpatient Medications  Scheduled Meds: . cefTRIAXone (ROCEPHIN) IVPB 1 gram/50 mL D5W  1 g Intravenous Q24H  . feeding supplement (ENSURE ENLIVE)  237 mL Oral BID BM  . ferrous sulfate  325 mg Oral Q breakfast  . multivitamin with minerals  1 tablet Oral Daily  . omega-3 acid ethyl esters  1 g Oral Daily  . pantoprazole  40 mg Oral Daily  . propranolol  20 mg Oral BID  . rivaroxaban  20 mg Oral Q supper   Continuous Infusions: . sodium chloride 50 mL/hr at 12/30/16 0650   PRN Meds:.acetaminophen **OR** acetaminophen, bisacodyl, calcium carbonate, diphenhydrAMINE, HYDROcodone-acetaminophen, ondansetron **OR** ondansetron (ZOFRAN) IV, polyethylene glycol  Micro Results Recent Results (from the past 240 hour(s))  Urine culture     Status: Abnormal (Preliminary result)   Collection Time: 12/28/16  8:49 PM  Result Value Ref Range Status   Specimen Description URINE, CATHETERIZED  Final   Special Requests NONE  Final   Culture (A)  Final    >=100,000 COLONIES/mL ESCHERICHIA COLI SUSCEPTIBILITIES TO FOLLOW Performed at [facility], [address], [place], [place] [address]    Report Status PENDING  Incomplete    Radiology Reports Dg Chest 2 View  Result Date: 12/28/2016 CLINICAL DATA:  83 y/o  F; chest pain. EXAM: CHEST  2 VIEW COMPARISON:  None. FINDINGS: The heart size and mediastinal contours are within normal limits. Aortic atherosclerosis with calcification. Chronic right lateral rib fracture deformities. No focal consolidation, effusion, or  pneumothorax identified. Minor biapical pleuroparenchymal scarring. IMPRESSION: No active cardiopulmonary disease. Electronically Signed   By: Mitzi Hansen M.D.   On: 12/28/2016 19:58   Ct Head Wo Contrast  Result Date: 12/28/2016 CLINICAL DATA:  Altered mental status. EXAM: CT HEAD WITHOUT CONTRAST TECHNIQUE: Contiguous axial images were obtained from the base of the skull through the vertex without intravenous contrast. COMPARISON:  Brain MRI 10/03/2012 FINDINGS: Brain: There is no evidence of acute cortical infarct, intracranial hemorrhage, mass, midline shift, or extra-axial fluid collection. A mega cisterna magna is incidentally noted. T2 hyperintensities in the deep cerebral white matter and basal ganglia bilaterally correspond to a combination of chronic small vessel ischemic disease/chronic lacunar infarcts and dilated perivascular spaces on the prior MRI. Mild cerebral atrophy is within normal limits for  age. Vascular: No hyperdense vessel or unexpected calcification. Skull: No fracture or focal osseous lesion. Sinuses/Orbits: Visualized paranasal sinuses and mastoid air cells are clear. No evidence of acute orbital abnormality. Other: None. IMPRESSION: 1. No evidence of acute intracranial abnormality. 2. Mild chronic small vessel ischemic disease. Electronically Signed   By: Sebastian Ache M.D.   On: 12/28/2016 20:07   Mr Brain Wo Contrast  Result Date: 12/29/2016 CLINICAL DATA:  Atrial fibrillation. Anti coagulated. New onset confusion. Speech disturbance. EXAM: MRI HEAD WITHOUT CONTRAST TECHNIQUE: Multiplanar, multiecho pulse sequences of the brain and surrounding structures were obtained without intravenous contrast. COMPARISON:  CT 12/28/2016.  MRI 10/03/2012. FINDINGS: Brain: Diffusion imaging does not show any acute or subacute infarction. There are mild chronic small-vessel ischemic changes of the pons. No focal cerebellar insult. Mega cisterna magna incidentally noted. Cerebral  hemispheres show a pattern of prominent perivascular spaces. There are mild chronic small-vessel ischemic changes of the hemispheric deep and subcortical white matter. No cortical or large vessel territory infarction. No mass lesion, hemorrhage, hydrocephalus or extra-axial collection. No pituitary mass. Vascular: Major vessels at the base of the brain show flow. Skull and upper cervical spine: Negative Sinuses/Orbits: Sinuses negative except for an insignificant retention cyst of the right maxillary sinus. Orbits negative. Previous intra-ocular lens implants. Other: None significant IMPRESSION: No acute finding by MRI. Age related atrophy. Mild chronic small-vessel ischemic changes of the cerebral hemispheric white matter. Background pattern of dilated perivascular spaces incidentally noted. Electronically Signed   By: Paulina Fusi M.D.   On: 12/29/2016 11:37     ELGERGAWY, DAWOOD M.D on 12/30/2016 at 11:10 AM  Between 7am to 7pm - Pager - [phone]  After 7pm go to [email or web address] - password [facility]  [facility] -  Office  [phone]

## 2016-12-31 LAB — BASIC METABOLIC PANEL
Anion gap: 7 (ref 5–15)
BUN: 7 mg/dL (ref 6–20)
CHLORIDE: 102 mmol/L (ref 101–111)
CO2: 22 mmol/L (ref 22–32)
CREATININE: 0.65 mg/dL (ref 0.44–1.00)
Calcium: 8.4 mg/dL — ABNORMAL LOW (ref 8.9–10.3)
GFR calc Af Amer: >60 mL/min (ref >60)
GFR calc non Af Amer: >60 mL/min (ref >60)
GLUCOSE: 125 mg/dL — AB (ref 65–99)
Potassium: 3.5 mmol/L (ref 3.5–5.1)
Sodium: 131 mmol/L — ABNORMAL LOW (ref 135–145)

## 2016-12-31 LAB — URINE CULTURE

## 2016-12-31 LAB — GLUCOSE, CAPILLARY: Glucose-Capillary: 127 mg/dL — ABNORMAL HIGH (ref 65–99)

## 2016-12-31 MED ORDER — POLYETHYLENE GLYCOL 3350 17 G PO PACK: 17.0000 g | PACK | Freq: Every day | ORAL | 0 refills | Status: DC | PRN

## 2016-12-31 MED ORDER — BISACODYL 5 MG PO TBEC
10.0000 mg | DELAYED_RELEASE_TABLET | Freq: Once | ORAL | Status: AC
  Administered 2016-12-31: 10 mg via ORAL
  Filled 2016-12-31: qty 2

## 2016-12-31 MED ORDER — CALCIUM CARBONATE ANTACID 500 MG PO CHEW: 1.0000 | CHEWABLE_TABLET | Freq: Three times a day (TID) | ORAL | Status: AC | PRN

## 2016-12-31 MED ORDER — POLYETHYLENE GLYCOL 3350 17 G PO PACK
17.0000 g | PACK | Freq: Every day | ORAL | Status: DC
  Administered 2016-12-31: 17 g via ORAL
  Filled 2016-12-31: qty 1

## 2016-12-31 MED ORDER — PROPRANOLOL HCL ER 60 MG PO CP24: 60.0000 mg | ORAL_CAPSULE | Freq: Every day | ORAL | Status: DC

## 2016-12-31 MED ORDER — PROPRANOLOL HCL 20 MG PO TABS: 20.0000 mg | ORAL_TABLET | Freq: Two times a day (BID) | ORAL | Status: DC

## 2016-12-31 MED ORDER — ENSURE ENLIVE PO LIQD: 237.0000 mL | Freq: Two times a day (BID) | ORAL | 12 refills | Status: AC

## 2016-12-31 MED ORDER — BISACODYL 5 MG PO TBEC: 5.0000 mg | DELAYED_RELEASE_TABLET | Freq: Every day | ORAL | 0 refills | Status: AC | PRN

## 2016-12-31 NOTE — Progress Notes (Signed)
[facility] [place] [facility] [place]

## 2016-12-31 NOTE — Discharge Instructions (Signed)
Follow with Primary MD Kaleen MaskELKINS,WILSON OLIVER, MD in 7 days   Get CBC, CMP, checked  by Primary MD next visit.    Activity: As tolerated with Full fall precautions use walker/cane & assistance as needed   Disposition SNF   Diet: Heart Healthy  , with feeding assistance and aspiration precautions.  For Heart failure patients - Check your Weight same time everyday, if you gain over 2 pounds, or you develop in leg swelling, experience more shortness of breath or chest pain, call your Primary MD immediately. Follow Cardiac Low Salt Diet and 1.5 lit/day fluid restriction.   On your next visit with your primary care physician please Get Medicines reviewed and adjusted.   Please request your Prim.MD to go over all Hospital Tests and Procedure/Radiological results at the follow up, please get all Hospital records sent to your Prim MD by signing hospital release before you go home.   If you experience worsening of your admission symptoms, develop shortness of breath, life threatening emergency, suicidal or homicidal thoughts you must seek medical attention immediately by calling 911 or calling your MD immediately  if symptoms less severe.  You Must read complete instructions/literature along with all the possible adverse reactions/side effects for all the Medicines you take and that have been prescribed to you. Take any new Medicines after you have completely understood and accpet all the possible adverse reactions/side effects.   Do not drive, operating heavy machinery, perform activities at heights, swimming or participation in water activities or provide baby sitting services if your were admitted for syncope or siezures until you have seen by Primary MD or a Neurologist and advised to do so again.  Do not drive when taking Pain medications.    Do not take more than prescribed Pain, Sleep and Anxiety Medications  Special Instructions: If you have smoked or chewed Tobacco  in the last 2 yrs  please stop smoking, stop any regular Alcohol  and or any Recreational drug use.  Wear Seat belts while driving.   Please note  You were cared for by a hospitalist during your hospital stay. If you have any questions about your discharge medications or the care you received while you were in the hospital after you are discharged, you can call the unit and asked to speak with the hospitalist on call if the hospitalist that took care of you is not available. Once you are discharged, your primary care physician will handle any further medical issues. Please note that NO REFILLS for any discharge medications will be authorized once you are discharged, as it is imperative that you return to your primary care physician (or establish a relationship with a primary care physician if you do not have one) for your aftercare needs so that they can reassess your need for medications and monitor your lab values.

## 2016-12-31 NOTE — Discharge Summary (Addendum)
Tanya [name], is a 83 y.o. female  DOB [birth year]  MRN [id number].  Admission date:  12/28/2016  Admitting Physician  Briscoe Deutscher, MD  Discharge Date:  12/31/2016   Primary MD  Kaleen Mask, MD  Recommendations for primary care physician for things to follow:  - please check CBC, BMP in 3 days.   Admission Diagnosis  Left-sided weakness [R53.1] Altered mental status, unspecified altered mental status type [R41.82]   Discharge Diagnosis  Left-sided weakness [R53.1] Altered mental status, unspecified altered mental status type [R41.82]    Principal Problem:   Acute encephalopathy Active Problems:   Essential tremor   Hyponatremia   Atrial fibrillation with RVR (HCC)   Acute lower UTI   Left-sided weakness      Past Medical History:  Diagnosis Date  . Atrial fibrillation (HCC)   . Barrett's esophagus   . Esophageal stricture   . Essential tremor   . GERD (gastroesophageal reflux disease)   . Hiatal hernia     Past Surgical History:  Procedure Laterality Date  . ABDOMINAL HYSTERECTOMY    . BALLOON DILATION N/A 09/06/2014   Procedure: BALLOON DILATION;  Surgeon: Louis Meckel, MD;  Location: [facility];  Service: Endoscopy;  Laterality: N/A;  . ESOPHAGOGASTRODUODENOSCOPY N/A 09/06/2014   Procedure: ESOPHAGOGASTRODUODENOSCOPY (EGD);  Surgeon: Louis Meckel, MD;  Location: [facility];  Service: Endoscopy;  Laterality: N/A;  . KNEE ARTHROSCOPY Right   . ROTATOR CUFF REPAIR Right        History of present illness and  Hospital Course:     Kindly see H&P for history of present illness and admission details, please review complete Labs, Consult reports and Test reports for all details in brief  HPI  from the history and physical done on the day of admission 12/29/2016  HPI: Tanya [name] is a 83 y.o. female with medical history significant for atrial fibrillation  Xarelto, essential tremor followed by neurology, GERD with Barrett esophagus, and hyperlipidemia who presents to the emergency department for evaluation of one of confusion. Patient is accompanied by her daughter who provides much of the history. Patient had reportedly been in her usual state of health until she was seemingly lethargic this afternoon and confused. She seemed to have persistent confusion throughout the day and then, at dinner time, seemed to have difficulty finding the right words to use. There had been no recent fall or trauma, no recent change in her medications, and she never experienced similar symptoms previously. Patient denies any headache, change in vision or hearing, or focal numbness or weakness.   ED Course: Upon arrival to the ED, patient is found to be afebrile, saturating adequately on room air, intermittently tachycardic, and with stable blood pressure. EKG features and atrial fibrillation with left axis deviation. Chest x-ray is negative for acute cardiopulmonary disease and noncontrast head CT is negative for acute intracranial abnormality. CMP is notable for a sodium of 124, CBC is unremarkable, troponin is undetectable, and urinalysis featured a positive nitrite with  rare bacteria and only 0-5 WBC. Patient was given a liter of normal saline and empiric Rocephin for suspected UTI. There is concern for possible subtle left-sided weakness and neurology was consulted by the ED physician. Neurology advised obtaining MRI [name] and medical admission for treatment of hyponatremia and suspected UTI. Patient remained intermittently tachycardic in the emergency department, but was stable blood pressure and no respiratory distress. She will be admitted to the telemetry unit for ongoing evaluation and management of acute encephalopathy likely secondary to hyponatremia and/or UTI, but with acute CVA not entirely excluded.  Hospital Course  83 y.o.femalewith medical history significant  foratrial fibrillation Xarelto, essential tremor followed by neurology, GERD with Barrett esophagus, and hyperlipidemia who presents to the emergency department for evaluation of one of confusion, no acute findings in CT head, workup significant for dehydration, hyponatremia and UTI.  Acute encephalopathy  - Chronic encephalopathy secondary to UTI, dehydration and hyponatremia, Tanya [name] with no evidence of CVA, phenobarbital level is undetectable(as been stopped before 2 months), currently improved with treatment of her dehydration, UTI and hyponatremia, currently back to baseline.  Hyponatremia  - Serum sodium 124 on admission, appears to be with clinical dehydration, treated with IV fluids during hospital stay, sodium significantly improved, is 131 at day of discharge , please recheck BMP in 3 days. - TSH within normal limit  UTI  - Like culture growing pansensitive Escherichia coli, treated with total of 3 days of IV Rocephin - Report she went for PCP on Thursday for dysuria.  Atrial fibrillation with RVR  - In atrial fibrillation with intermittent RVR , required diltiazem IVP 15 mg x1 and is monitored on telemetry , currently appears to be controlled, - CHADS-VASc at least 60 (age x2, gender), Continue Xarelto   Essential tremor  - Followed by neurology at [facility] and under eval for possible deep-[name] stimulator , phenobarbital has been stopped by her urologist, she is on propranolol currently.   Discharge Condition:  stable   Follow UP   Contact information for follow-up providers    Kaleen Mask, MD Follow up.   Specialty:  Family Medicine Contact information: [address] [place] [address] [phone]            Contact information for after-discharge care    Destination    [facility] .   Specialty:  Skilled Nursing Facility Contact information: [address] [place]  [address] [phone]                    Discharge Instructions  and  Discharge Medications    Discharge Instructions    Discharge instructions    Complete by:  As directed    Follow with Primary MD Kaleen Mask, MD in 7 days   Get CBC, CMP, checked  by Primary MD next visit.    Activity: As tolerated with Full fall precautions use walker/cane & assistance as needed   Disposition SNF   Diet: Heart Healthy  , with feeding assistance and aspiration precautions.  For Heart failure patients - Check your Weight same time everyday, if you gain over 2 pounds, or you develop in leg swelling, experience more shortness of breath or chest pain, call your Primary MD immediately. Follow Cardiac Low Salt Diet and 1.5 lit/day fluid restriction.   On your next visit with your primary care physician please Get Medicines reviewed and adjusted.   Please request your Prim.MD to go over all Hospital Tests and  Procedure/Radiological results at the follow up, please get all Hospital records sent to your Prim MD by signing hospital release before you go home.   If you experience worsening of your admission symptoms, develop shortness of breath, life threatening emergency, suicidal or homicidal thoughts you must seek medical attention immediately by calling 911 or calling your MD immediately  if symptoms less severe.  You Must read complete instructions/literature along with all the possible adverse reactions/side effects for all the Medicines you take and that have been prescribed to you. Take any new Medicines after you have completely understood and accpet all the possible adverse reactions/side effects.   Do not drive, operating heavy machinery, perform activities at heights, swimming or participation in water activities or provide baby sitting services if your were admitted for syncope or siezures until you have seen by Primary MD or a Neurologist and advised to do so again.  Do not  drive when taking Pain medications.    Do not take more than prescribed Pain, Sleep and Anxiety Medications  Special Instructions: If you have smoked or chewed Tobacco  in the last 2 yrs please stop smoking, stop any regular Alcohol  and or any Recreational drug use.  Wear Seat belts while driving.   Please note  You were cared for by a hospitalist during your hospital stay. If you have any questions about your discharge medications or the care you received while you were in the hospital after you are discharged, you can call the unit and asked to speak with the hospitalist on call if the hospitalist that took care of you is not available. Once you are discharged, your primary care physician will handle any further medical issues. Please note that NO REFILLS for any discharge medications will be authorized once you are discharged, as it is imperative that you return to your primary care physician (or establish a relationship with a primary care physician if you do not have one) for your aftercare needs so that they can reassess your need for medications and monitor your lab values.   Increase activity slowly    Complete by:  As directed      Allergies as of 12/31/2016   No Known Allergies     Medication List    STOP taking these medications   PHENobarbital 64.8 MG tablet Commonly known as:  LUMINAL     TAKE these medications   bisacodyl 5 MG EC tablet Commonly known as:  DULCOLAX Take 1 tablet (5 mg total) by mouth daily as needed for moderate constipation.   calcium carbonate 500 MG chewable tablet Commonly known as:  TUMS - dosed in mg elemental calcium Chew 1 tablet (200 mg of elemental calcium total) by mouth 3 (three) times daily as needed for indigestion or heartburn.   CENTRUM SILVER PO Take 1 tablet by mouth daily.   CRANBERRY PO Take by mouth.   diphenhydramine-acetaminophen 25-500 MG Tabs tablet Commonly known as:  TYLENOL PM Take 2 tablets by mouth at bedtime as  needed.   feeding supplement (ENSURE ENLIVE) Liqd Take 237 mLs by mouth 2 (two) times daily between meals.   ferrous sulfate 325 (65 FE) MG tablet Take 325 mg by mouth daily with breakfast.   Fish Oil 1200 MG Caps Take 1 capsule by mouth daily.   NEXIUM 40 MG capsule Generic drug:  esomeprazole Take 1 capsule (40 mg total) by mouth daily before breakfast.   polyethylene glycol packet Commonly known as:  MIRALAX / GLYCOLAX  Take 17 g by mouth daily as needed for mild constipation.   propranolol ER 60 MG 24 hr capsule Commonly known as:  INDERAL LA Take 1 capsule (60 mg total) by mouth daily.   XARELTO 20 MG Tabs tablet Generic drug:  rivaroxaban Take 20 mg by mouth daily with supper.         Diet and Activity recommendation: See Discharge Instructions above   Consults obtained - None   Major procedures and Radiology Reports - PLEASE review detailed and final reports for all details, in brief -     Dg Chest 2 View  Result Date: 12/28/2016 CLINICAL DATA:  83 y/o  F; chest pain. EXAM: CHEST  2 VIEW COMPARISON:  None. FINDINGS: The heart size and mediastinal contours are within normal limits. Aortic atherosclerosis with calcification. Chronic right lateral rib fracture deformities. No focal consolidation, effusion, or pneumothorax identified. Minor biapical pleuroparenchymal scarring. IMPRESSION: No active cardiopulmonary disease. Electronically Signed   By: Mitzi HansenLance  Furusawa-Stratton M.D.   On: 12/28/2016 19:58   Ct Head Wo Contrast  Result Date: 12/28/2016 CLINICAL DATA:  Altered mental status. EXAM: CT HEAD WITHOUT CONTRAST TECHNIQUE: Contiguous axial images were obtained from the base of the skull through the vertex without intravenous contrast. COMPARISON:  [name] MRI 10/03/2012 FINDINGS: [name]: There is no evidence of acute cortical infarct, intracranial hemorrhage, mass, midline shift, or extra-axial fluid collection. A mega cisterna magna is incidentally noted. T2  hyperintensities in the deep cerebral white matter and basal ganglia bilaterally correspond to a combination of chronic small vessel ischemic disease/chronic lacunar infarcts and dilated perivascular spaces on the prior MRI. Mild cerebral atrophy is within normal limits for age. Vascular: No hyperdense vessel or unexpected calcification. Skull: No fracture or focal osseous lesion. Sinuses/Orbits: Visualized paranasal sinuses and mastoid air cells are clear. No evidence of acute orbital abnormality. Other: None. IMPRESSION: 1. No evidence of acute intracranial abnormality. 2. Mild chronic small vessel ischemic disease. Electronically Signed   By: Sebastian AcheAllen  Grady M.D.   On: 12/28/2016 20:07   Mr [name] Wo Contrast  Result Date: 12/29/2016 CLINICAL DATA:  Atrial fibrillation. Anti coagulated. New onset confusion. Speech disturbance. EXAM: MRI HEAD WITHOUT CONTRAST TECHNIQUE: Multiplanar, multiecho pulse sequences of the [name] and surrounding structures were obtained without intravenous contrast. COMPARISON:  CT 12/28/2016.  MRI 10/03/2012. FINDINGS: [name]: Diffusion imaging does not show any acute or subacute infarction. There are mild chronic small-vessel ischemic changes of the pons. No focal cerebellar insult. Mega cisterna magna incidentally noted. Cerebral hemispheres show a pattern of prominent perivascular spaces. There are mild chronic small-vessel ischemic changes of the hemispheric deep and subcortical white matter. No cortical or large vessel territory infarction. No mass lesion, hemorrhage, hydrocephalus or extra-axial collection. No pituitary mass. Vascular: Major vessels at the base of the [name] show flow. Skull and upper cervical spine: Negative Sinuses/Orbits: Sinuses negative except for an insignificant retention cyst of the right maxillary sinus. Orbits negative. Previous intra-ocular lens implants. Other: None significant IMPRESSION: No acute finding by MRI. Age related atrophy. Mild chronic  small-vessel ischemic changes of the cerebral hemispheric white matter. Background pattern of dilated perivascular spaces incidentally noted. Electronically Signed   By: Paulina FusiMark  Shogry M.D.   On: 12/29/2016 11:37    Micro Results    Recent Results (from the past 240 hour(s))  Urine culture     Status: Abnormal   Collection Time: 12/28/16  8:49 PM  Result Value Ref Range Status   Specimen Description URINE, CATHETERIZED  Final   Special Requests NONE  Final   Culture >=100,000 COLONIES/mL ESCHERICHIA COLI (A)  Final   Report Status 12/31/2016 FINAL  Final   Organism ID, Bacteria ESCHERICHIA COLI (A)  Final      Susceptibility   Escherichia coli - MIC*    AMPICILLIN <=2 SENSITIVE Sensitive     CEFAZOLIN <=4 SENSITIVE Sensitive     CEFTRIAXONE <=1 SENSITIVE Sensitive     CIPROFLOXACIN <=0.25 SENSITIVE Sensitive     GENTAMICIN <=1 SENSITIVE Sensitive     IMIPENEM <=0.25 SENSITIVE Sensitive     NITROFURANTOIN <=16 SENSITIVE Sensitive     TRIMETH/SULFA <=20 SENSITIVE Sensitive     AMPICILLIN/SULBACTAM <=2 SENSITIVE Sensitive     PIP/TAZO <=4 SENSITIVE Sensitive     Extended ESBL NEGATIVE Sensitive     * >=100,000 COLONIES/mL ESCHERICHIA COLI       Today   Subjective:   Tanya [name] today has no headache,no chest or abdominal pain, or generalized weakness , no acute events overnight  Objective:   Blood pressure 125/80, pulse 77, temperature 99.1 F (37.3 C), temperature source Oral, resp. rate 18, height 5' 7.5" (1.715 m), weight 60.8 kg (134 lb 0.6 oz), SpO2 99 %.   Intake/Output Summary (Last 24 hours) at 12/31/16 1427 Last data filed at 12/31/16 1230  Gross per 24 hour  Intake             2340 ml  Output             1700 ml  Net              640 ml    Exam Awake Alert, Oriented x 3,Frail Supple Neck,No JVD,  Symmetrical Chest wall movement, Good air movement bilaterally, CTAB RRR,No Gallops,Rubs or new Murmurs, No Parasternal Heave +ve B.Sounds, Abd Soft, Non tender,  No rebound -guarding or rigidity. No Cyanosis, Clubbing or edema.  Data Review   CBC w Diff:  Lab Results  Component Value Date   WBC 7.7 12/30/2016   HGB 10.4 (L) 12/30/2016   HCT 29.1 (L) 12/30/2016   PLT 209 12/30/2016   LYMPHOPCT 32 12/28/2016   MONOPCT 9 12/28/2016   EOSPCT 3 12/28/2016   BASOPCT 0 12/28/2016    CMP:  Lab Results  Component Value Date   NA 131 (L) 12/31/2016   K 3.5 12/31/2016   CL 102 12/31/2016   CO2 22 12/31/2016   BUN 7 12/31/2016   CREATININE 0.65 12/31/2016   PROT 7.0 12/28/2016   ALBUMIN 4.0 12/28/2016   BILITOT 0.5 12/28/2016   ALKPHOS 56 12/28/2016   AST 21 12/28/2016   ALT 21 12/28/2016  .   Total Time in preparing paper work, data evaluation and todays exam - 35 minutes  Evy Lutterman M.D on 12/31/2016 at 2:27 PM  [facility]   Office  [phone]

## 2016-12-31 NOTE — Clinical Social Work Placement (Signed)
Patient received and accepted bed offer at [facility]. Patient's daughter Tanya [name] reported that she or her sister will be transporting patient to facility. Facility will send number to call report and patient's room number after receiving discharge summary. CSW will continue to follow patient for discharge planning.  CLINICAL SOCIAL WORK PLACEMENT  NOTE  Date:  12/31/2016  Patient Details  Name: Tanya [name] MRN: [id number] Date of Birth: 02-Oct-1932  Clinical Social Work is seeking post-discharge placement for this patient at the Skilled  Nursing Facility level of care (*CSW will initial, date and re-position this form in  chart as items are completed):  Yes   Patient/family provided with [facility] Clinical Social Work Department's list of facilities offering this level of care within the geographic area requested by the patient (or if unable, by the patient's family).  Yes   Patient/family informed of their freedom to choose among providers that offer the needed level of care, that participate in Medicare, Medicaid or managed care program needed by the patient, have an available bed and are willing to accept the patient.  Yes   Patient/family informed of [facility]'s ownership interest in [facility] and [facility], as well as of the fact that they are under no obligation to receive care at these facilities.  PASRR submitted to EDS on 12/30/16     PASRR number received on 12/30/16     Existing PASRR number confirmed on       FL2 transmitted to all facilities in geographic area requested by pt/family on 12/30/16     FL2 transmitted to all facilities within larger geographic area on 12/30/16     Patient informed that his/her managed care company has contracts with or will negotiate with certain facilities, including the following:        Yes   Patient/family informed of bed offers received.  Patient chooses bed at [facility]     Physician  recommends and patient chooses bed at      Patient to be transferred to [facility] on  .  [facility] T[name] [phone])     Patient family notified on   of transfer.  Name of family member notified:        PHYSICIAN       Additional Comment:    _______________________________________________ Antionette PolesKimberly L Jaclynne Baldo, LCSW 12/31/2016, 10:35 AM

## 2016-12-31 NOTE — Clinical Social Work Placement (Signed)
CSW faxed discharge summary to [facility]. CSW notified patient's daugther Tanya [name] that patient is ready for discharge. Patient's daughter Tanya [name] reported that patient is going to be transported by her other daughter Tanya [name]. Patient's RN can call report to [phone], patient is going to room 208.   CLINICAL SOCIAL WORK PLACEMENT  NOTE  Date:  12/31/2016  Patient Details  Name: Tanya [name] MRN: [id number] Date of Birth: 1932/03/11  Clinical Social Work is seeking post-discharge placement for this patient at the Skilled  Nursing Facility level of care (*CSW will initial, date and re-position this form in  chart as items are completed):  Yes   Patient/family provided with [facility] Clinical Social Work Department's list of facilities offering this level of care within the geographic area requested by the patient (or if unable, by the patient's family).  Yes   Patient/family informed of their freedom to choose among providers that offer the needed level of care, that participate in Medicare, Medicaid or managed care program needed by the patient, have an available bed and are willing to accept the patient.  Yes   Patient/family informed of [facility]'s ownership interest in [facility] and [facility], as well as of the fact that they are under no obligation to receive care at these facilities.  PASRR submitted to EDS on 12/30/16     PASRR number received on 12/30/16     Existing PASRR number confirmed on       FL2 transmitted to all facilities in geographic area requested by pt/family on 12/30/16     FL2 transmitted to all facilities within larger geographic area on 12/30/16     Patient informed that his/her managed care company has contracts with or will negotiate with certain facilities, including the following:        Yes   Patient/family informed of bed offers received.  Patient chooses bed at [facility]     Physician recommends and patient  chooses bed at      Patient to be transferred to [facility] on 12/31/16.  [name] [phone])     Patient family notified on 12/31/16 of transfer.  Name of family member notified:  Family - Daughter Tanya [name] [phone])     PHYSICIAN       Additional Comment:    _______________________________________________ Antionette PolesKimberly L Maejor Erven, LCSW 12/31/2016, 2:18 PM

## 2017-02-13 HISTORY — PX: DEEP BRAIN STIMULATOR PLACEMENT: SHX608

## 2017-03-27 ENCOUNTER — Emergency Department (HOSPITAL_COMMUNITY): Payer: Medicare Other

## 2017-03-27 ENCOUNTER — Observation Stay (HOSPITAL_COMMUNITY)
Admission: EM | Admit: 2017-03-27 | Discharge: 2017-03-30 | Disposition: A | Discharge status: Home / Self Care | Payer: Medicare Other | Attending: Internal Medicine | Admitting: Internal Medicine

## 2017-03-27 ENCOUNTER — Encounter (HOSPITAL_COMMUNITY): Payer: Self-pay | Admitting: Internal Medicine

## 2017-03-27 DIAGNOSIS — K5900 Constipation, unspecified: Secondary | ICD-10-CM | POA: Diagnosis present

## 2017-03-27 DIAGNOSIS — G250 Essential tremor: Secondary | ICD-10-CM | POA: Insufficient documentation

## 2017-03-27 DIAGNOSIS — Z681 Body mass index (BMI) 19 or less, adult: Secondary | ICD-10-CM | POA: Diagnosis not present

## 2017-03-27 DIAGNOSIS — I482 Chronic atrial fibrillation, unspecified: Secondary | ICD-10-CM

## 2017-03-27 DIAGNOSIS — Z7901 Long term (current) use of anticoagulants: Secondary | ICD-10-CM | POA: Insufficient documentation

## 2017-03-27 DIAGNOSIS — N390 Urinary tract infection, site not specified: Secondary | ICD-10-CM | POA: Insufficient documentation

## 2017-03-27 DIAGNOSIS — Z79899 Other long term (current) drug therapy: Secondary | ICD-10-CM | POA: Insufficient documentation

## 2017-03-27 DIAGNOSIS — R079 Chest pain, unspecified: Secondary | ICD-10-CM

## 2017-03-27 DIAGNOSIS — K5909 Other constipation: Secondary | ICD-10-CM | POA: Diagnosis not present

## 2017-03-27 DIAGNOSIS — R0789 Other chest pain: Secondary | ICD-10-CM

## 2017-03-27 DIAGNOSIS — Z66 Do not resuscitate: Secondary | ICD-10-CM | POA: Insufficient documentation

## 2017-03-27 DIAGNOSIS — K5649 Other impaction of intestine: Secondary | ICD-10-CM | POA: Diagnosis present

## 2017-03-27 DIAGNOSIS — I4891 Unspecified atrial fibrillation: Secondary | ICD-10-CM | POA: Diagnosis not present

## 2017-03-27 DIAGNOSIS — R0602 Shortness of breath: Secondary | ICD-10-CM

## 2017-03-27 DIAGNOSIS — E43 Unspecified severe protein-calorie malnutrition: Secondary | ICD-10-CM | POA: Diagnosis not present

## 2017-03-27 DIAGNOSIS — R0600 Dyspnea, unspecified: Secondary | ICD-10-CM | POA: Insufficient documentation

## 2017-03-27 DIAGNOSIS — K219 Gastro-esophageal reflux disease without esophagitis: Secondary | ICD-10-CM | POA: Diagnosis not present

## 2017-03-27 DIAGNOSIS — E871 Hypo-osmolality and hyponatremia: Secondary | ICD-10-CM | POA: Insufficient documentation

## 2017-03-27 LAB — URINALYSIS, ROUTINE W REFLEX MICROSCOPIC
BILIRUBIN URINE: NEGATIVE
GLUCOSE, UA: NEGATIVE mg/dL
HGB URINE DIPSTICK: NEGATIVE
Ketones, ur: NEGATIVE mg/dL
Leukocytes, UA: NEGATIVE
Nitrite: NEGATIVE
PROTEIN: NEGATIVE mg/dL
Specific Gravity, Urine: 1.006 (ref 1.005–1.030)
pH: 8 (ref 5.0–8.0)

## 2017-03-27 LAB — CBC WITH DIFFERENTIAL/PLATELET
HEMATOCRIT: 35.4 % — AB (ref 36.0–46.0)
HEMOGLOBIN: 12.1 g/dL (ref 12.0–15.0)
MCH: 30.6 pg (ref 26.0–34.0)
MCHC: 34.2 g/dL (ref 30.0–36.0)
MCV: 89.6 fL (ref 78.0–100.0)
Platelets: 302 10*3/uL (ref 150–400)
RBC: 3.95 MIL/uL (ref 3.87–5.11)
RDW: 12.7 % (ref 11.5–15.5)
WBC: 13.3 10*3/uL — AB (ref 4.0–10.5)

## 2017-03-27 LAB — I-STAT TROPONIN, ED
TROPONIN I, POC: 0 ng/mL (ref 0.00–0.08)
Troponin i, poc: 0.02 ng/mL (ref 0.00–0.08)

## 2017-03-27 LAB — BASIC METABOLIC PANEL
ANION GAP: 10 (ref 5–15)
BUN: 9 mg/dL (ref 6–20)
CHLORIDE: 97 mmol/L — AB (ref 101–111)
CO2: 23 mmol/L (ref 22–32)
CREATININE: 0.82 mg/dL (ref 0.44–1.00)
Calcium: 9.1 mg/dL (ref 8.9–10.3)
GFR calc non Af Amer: >60 mL/min (ref >60)
Glucose, Bld: 112 mg/dL — ABNORMAL HIGH (ref 65–99)
POTASSIUM: 4.1 mmol/L (ref 3.5–5.1)
SODIUM: 130 mmol/L — AB (ref 135–145)

## 2017-03-27 LAB — TSH: TSH: 2.395 u[IU]/mL (ref 0.350–4.500)

## 2017-03-27 LAB — BRAIN NATRIURETIC PEPTIDE: B NATRIURETIC PEPTIDE 5: 142 pg/mL — AB (ref 0.0–100.0)

## 2017-03-27 LAB — TROPONIN I: TROPONIN I: 0.06 ng/mL — AB (ref <0.03)

## 2017-03-27 MED ORDER — PROPRANOLOL HCL ER 60 MG PO CP24: 60.0000 mg | ORAL_CAPSULE | Freq: Every day | ORAL | Status: DC

## 2017-03-27 MED ORDER — METOPROLOL TARTRATE 5 MG/5ML IV SOLN
5.0000 mg | Freq: Once | INTRAVENOUS | Status: AC
  Administered 2017-03-27: 5 mg via INTRAVENOUS
  Filled 2017-03-27: qty 5

## 2017-03-27 MED ORDER — ENSURE ENLIVE PO LIQD
237.0000 mL | Freq: Three times a day (TID) | ORAL | Status: DC
  Administered 2017-03-27 – 2017-03-30 (×7): 237 mL via ORAL

## 2017-03-27 MED ORDER — ASPIRIN 81 MG PO CHEW
324.0000 mg | CHEWABLE_TABLET | Freq: Once | ORAL | Status: AC
  Administered 2017-03-27: 324 mg via ORAL
  Filled 2017-03-27: qty 4

## 2017-03-27 MED ORDER — SENNA 8.6 MG PO TABS
1.0000 | ORAL_TABLET | ORAL | Status: DC
  Administered 2017-03-27 – 2017-03-30 (×4): 8.6 mg via ORAL
  Filled 2017-03-27 (×4): qty 1

## 2017-03-27 MED ORDER — CEPHALEXIN 500 MG PO CAPS
500.0000 mg | ORAL_CAPSULE | Freq: Two times a day (BID) | ORAL | Status: AC
  Administered 2017-03-27 – 2017-03-28 (×3): 500 mg via ORAL
  Filled 2017-03-27 (×3): qty 1

## 2017-03-27 MED ORDER — ENSURE ENLIVE PO LIQD: 237.0000 mL | Freq: Every day | ORAL | Status: DC | PRN

## 2017-03-27 MED ORDER — PANTOPRAZOLE SODIUM 40 MG PO TBEC
40.0000 mg | DELAYED_RELEASE_TABLET | Freq: Every day | ORAL | Status: DC
  Administered 2017-03-27 – 2017-03-30 (×4): 40 mg via ORAL
  Filled 2017-03-27 (×4): qty 1

## 2017-03-27 MED ORDER — ACETAMINOPHEN 325 MG PO TABS: 650.0000 mg | ORAL_TABLET | ORAL | Status: DC | PRN

## 2017-03-27 MED ORDER — RIVAROXABAN 20 MG PO TABS
20.0000 mg | ORAL_TABLET | Freq: Every day | ORAL | Status: DC
  Administered 2017-03-27 – 2017-03-29 (×3): 20 mg via ORAL
  Filled 2017-03-27 (×3): qty 1

## 2017-03-27 MED ORDER — SODIUM CHLORIDE 0.9 % IV SOLN
INTRAVENOUS | Status: AC
  Administered 2017-03-27: 09:00:00 via INTRAVENOUS

## 2017-03-27 MED ORDER — CALCIUM CARBONATE ANTACID 500 MG PO CHEW: 1.0000 | CHEWABLE_TABLET | Freq: Three times a day (TID) | ORAL | Status: DC | PRN

## 2017-03-27 MED ORDER — PROPRANOLOL HCL ER 60 MG PO CP24
60.0000 mg | ORAL_CAPSULE | Freq: Every day | ORAL | Status: DC
  Administered 2017-03-27 – 2017-03-29 (×3): 60 mg via ORAL
  Filled 2017-03-27 (×3): qty 1

## 2017-03-27 MED ORDER — POLYETHYLENE GLYCOL 3350 17 G PO PACK: 17.0000 g | PACK | Freq: Every day | ORAL | Status: DC | PRN

## 2017-03-27 MED ORDER — ALPRAZOLAM 0.25 MG PO TABS
0.2500 mg | ORAL_TABLET | Freq: Two times a day (BID) | ORAL | Status: DC | PRN
  Administered 2017-03-27: 0.25 mg via ORAL
  Filled 2017-03-27: qty 1

## 2017-03-27 MED ORDER — ONDANSETRON HCL 4 MG/2ML IJ SOLN: 4.0000 mg | Freq: Four times a day (QID) | INTRAMUSCULAR | Status: DC | PRN

## 2017-03-27 MED ORDER — ALBUTEROL SULFATE (2.5 MG/3ML) 0.083% IN NEBU
5.0000 mg | INHALATION_SOLUTION | Freq: Once | RESPIRATORY_TRACT | Status: AC
  Administered 2017-03-27: 5 mg via RESPIRATORY_TRACT
  Filled 2017-03-27: qty 6

## 2017-03-27 MED ORDER — TRAZODONE HCL 150 MG PO TABS
150.0000 mg | ORAL_TABLET | Freq: Every day | ORAL | Status: DC
  Administered 2017-03-27 – 2017-03-29 (×3): 150 mg via ORAL
  Filled 2017-03-27 (×3): qty 1

## 2017-03-27 MED ORDER — BISACODYL 5 MG PO TBEC: 5.0000 mg | DELAYED_RELEASE_TABLET | Freq: Every day | ORAL | Status: DC | PRN

## 2017-03-27 NOTE — ED Triage Notes (Signed)
Pt from home via [facility]. Per pt, she started having SHOB x5hrs. Denies any other complaints @ or pain @ this time. Recently had a deep brain stimulator implanted 05/30. Hx of afib. A&O x3, baseline per daughter.

## 2017-03-27 NOTE — ED Provider Notes (Signed)
[facility]-EMERGENCY DEPT Provider Note   CSN: [id number] Arrival date & time: 03/27/17  4098  By signing my name below, I, [name], attest that this documentation has been prepared under the direction and in the presence of Horton, Mayer Masker, MD. Electronically Signed: [name], ED Scribe. 03/27/17. 3:45 AM.  History   Chief Complaint Chief Complaint  Patient presents with  . Shortness of Breath   The history is provided by the patient. No language interpreter was used.    HPI Comments: Tanya Little is a 83 y.o. female with a PMHx of AFib, esophageal stricture, GERD, hiatal hernia, and essential tremor, who presents to the Emergency Department complaining of persistent, improving shortness of breath beginning 2-3 hours ago. Per family member, pt went to bed at her baseline last night and woke up several hours ago with acute onset of shortness of breath. Her shortness of breath is worse with laying flat. No treatments for her symptoms were tried prior to coming into the ED; however, she notes that her breathing treatment while in the ED has improved her symptoms. No h/o CHF, COPD.  She is currently on Xarelto and has been compliant with this medication daily. No recent illness/infections. She has a distant h/o smoking. Family member denies chest pain, cough, congestion, fever, or any other associated symptoms. Daughter reports that she is currently on antibiotics for recurrent urinary tract infection. Unsure which antibiotic she is taking.  Past Medical History:  Diagnosis Date  . Atrial fibrillation (HCC)   . Barrett's esophagus   . Esophageal stricture   . Essential tremor   . GERD (gastroesophageal reflux disease)   . Hiatal hernia    Patient Active Problem List   Diagnosis Date Noted  . Acute dyspnea 03/27/2017  . Chest pain at rest 03/27/2017  . Acute encephalopathy 12/28/2016  . Hyponatremia 12/28/2016  . Atrial fibrillation with RVR (HCC) 12/28/2016  .  Acute lower UTI 12/28/2016  . Left-sided weakness 12/28/2016  . Esophageal stricture 09/06/2014  . Dysphagia, pharyngoesophageal phase 08/26/2014  . Encounter for colorectal cancer screening 08/26/2014  . Essential tremor 08/26/2014   Past Surgical History:  Procedure Laterality Date  . ABDOMINAL HYSTERECTOMY    . BALLOON DILATION N/A 09/06/2014   Procedure: BALLOON DILATION;  Surgeon: Louis Meckel, MD;  Location: [facility];  Service: Endoscopy;  Laterality: N/A;  . ESOPHAGOGASTRODUODENOSCOPY N/A 09/06/2014   Procedure: ESOPHAGOGASTRODUODENOSCOPY (EGD);  Surgeon: Louis Meckel, MD;  Location: [facility];  Service: Endoscopy;  Laterality: N/A;  . KNEE ARTHROSCOPY Right   . ROTATOR CUFF REPAIR Right    OB History    No data available     Home Medications    Prior to Admission medications   Medication Sig Start Date End Date Taking? Authorizing Provider  bisacodyl (DULCOLAX) 5 MG EC tablet Take 1 tablet (5 mg total) by mouth daily as needed for moderate constipation. 12/31/16  Yes Elgergawy, Leana Roe, MD  calcium carbonate (TUMS - DOSED IN MG ELEMENTAL CALCIUM) 500 MG chewable tablet Chew 1 tablet (200 mg of elemental calcium total) by mouth 3 (three) times daily as needed for indigestion or heartburn. 12/31/16  Yes Elgergawy, Leana Roe, MD  cephALEXin (KEFLEX) 500 MG capsule Take 500 mg by mouth 2 (two) times daily.   Yes [provider]  feeding supplement, ENSURE ENLIVE, (ENSURE ENLIVE) LIQD Take 237 mLs by mouth 2 (two) times daily between meals. Patient taking differently: Take 237 mLs by mouth daily as  needed (meal replacement).  12/31/16  Yes Elgergawy, Leana Roe, MD  Multiple Vitamins-Minerals (CENTRUM SILVER PO) Take 1 tablet by mouth daily.   Yes [provider]  NEXIUM 40 MG capsule Take 1 capsule (40 mg total) by mouth daily before breakfast. Patient taking differently: Take 40 mg by mouth every other day.  12/05/16  Yes Iva Boop, MD    polyethylene glycol [facility]) packet Take 17 g by mouth daily as needed for mild constipation. 12/31/16  Yes Elgergawy, Leana Roe, MD  potassium chloride (K-DUR,KLOR-CON) 10 MEQ tablet Take 10 mEq by mouth daily.   Yes [provider]  propranolol ER (INDERAL LA) 60 MG 24 hr capsule Take 1 capsule (60 mg total) by mouth daily. 12/31/16  Yes Elgergawy, Leana Roe, MD  rivaroxaban (XARELTO) 20 MG TABS tablet Take 20 mg by mouth every morning.    Yes [provider]  senna (SENOKOT) 8.6 MG TABS tablet Take 1 tablet by mouth every morning.   Yes [provider]  traZODone (DESYREL) 150 MG tablet Take 150 mg by mouth at bedtime.   Yes [provider]   Family History Family History  Problem Relation Age of Onset  . Diabetes Sister   . Lung cancer Brother   . Cancer Other        nephew  . Aneurysm Brother        x 3   Social History Social History  Substance Use Topics  . Smoking status: Former Smoker    Types: Cigarettes    Quit date: 08/26/2004  . Smokeless tobacco: Never Used  . Alcohol use No   Allergies   Patient has no known allergies.  Review of Systems Review of Systems  Constitutional: Negative for fever.  HENT: Negative for congestion.   Respiratory: Positive for shortness of breath. Negative for cough.   Cardiovascular: Negative for chest pain.  Gastrointestinal: Negative for abdominal pain, nausea and vomiting.  Neurological: Positive for tremors.  All other systems reviewed and are negative.  Physical Exam Updated Vital Signs BP (!) 143/93   Pulse (!) 57   Temp 97.5 F (36.4 C) (Oral)   Resp 16   Ht 5\' 10"  (1.778 m)   Wt 54.4 kg (120 lb)   SpO2 98%   BMI 17.22 kg/m   Physical Exam  Constitutional: She is oriented to person, place, and time. No distress.  Elderly, thin, no acute distress  HENT:  Head: Normocephalic and atraumatic.  Cardiovascular: Normal heart sounds.   Tachycardia, irregular rhythm   Pulmonary/Chest: Effort normal and breath sounds normal. No respiratory distress. She has no wheezes.  Stimulator palpated left upper chest, incision clean dry and intact, mild erythema  Abdominal: Soft. Bowel sounds are normal. There is no tenderness. There is no guarding.  Musculoskeletal: She exhibits no edema.  Neurological: She is alert and oriented to person, place, and time.  Skin: Skin is warm and dry.  Psychiatric: She has a normal mood and affect.  Nursing note and vitals reviewed.  ED Treatments / Results  DIAGNOSTIC STUDIES: Oxygen Saturation is 100% on RA, normal by my interpretation.   COORDINATION OF CARE: 3:45 AM-Discussed next steps with pt. Pt verbalized understanding and is agreeable with the plan.   Labs (all labs ordered are listed, but only abnormal results are displayed) Labs Reviewed  CBC WITH DIFFERENTIAL/PLATELET - Abnormal; Notable for the following:       Result Value   WBC 13.3 (*)  HCT 35.4 (*)    All other components within normal limits  BASIC METABOLIC PANEL - Abnormal; Notable for the following:    Sodium 130 (*)    Chloride 97 (*)    Glucose, Bld 112 (*)    All other components within normal limits  BRAIN NATRIURETIC PEPTIDE - Abnormal; Notable for the following:    B Natriuretic Peptide 142.0 (*)    All other components within normal limits  URINALYSIS, ROUTINE W REFLEX MICROSCOPIC  I-STAT TROPOININ, ED  I-STAT TROPOININ, ED    EKG  EKG Interpretation  Date/Time:  Wednesday March 27 2017 06:47:48 EDT Ventricular Rate:  118 PR Interval:    QRS Duration: 85 QT Interval:  318 QTC Calculation: 446 R Axis:   -26 Text Interpretation:  Atrial fibrillation Borderline left axis deviation Consider anterior infarct Artifact in lead(s) I II aVR aVL aVF V1 V2 V3 V4 V5 V6 Confirmed by Ross Marcus ([id number]) on 03/27/2017 7:04:46 AM      Radiology Dg Chest 2 View  Result Date: 03/27/2017 CLINICAL DATA:  Acute onset of shortness of  breath. Initial encounter. EXAM: CHEST  2 VIEW COMPARISON:  Chest radiograph performed 12/28/2016 FINDINGS: The lungs are well-aerated and clear. There is no evidence of focal opacification, pleural effusion or pneumothorax. The heart is normal in size; the mediastinal contour is within normal limits. A metallic device is noted at the left chest wall, with a lead extending into the neck. No acute osseous abnormalities are seen. Chronic right-sided rib deformities are noted. IMPRESSION: No acute cardiopulmonary process seen. Electronically Signed   By: Roanna Raider M.D.   On: 03/27/2017 05:03    Procedures Procedures   Medications Ordered in ED Medications  propranolol ER (INDERAL LA) 24 hr capsule 60 mg (60 mg Oral Given 03/27/17 0654)  albuterol (PROVENTIL) (2.5 MG/3ML) 0.083% nebulizer solution 5 mg (5 mg Nebulization Given 03/27/17 0339)  metoprolol tartrate (LOPRESSOR) injection 5 mg (5 mg Intravenous Given 03/27/17 0654)  aspirin chewable tablet 324 mg (324 mg Oral Given 03/27/17 0704)   Initial Impression / Assessment and Plan / ED Course  I have reviewed the triage vital signs and the nursing notes.  Pertinent labs & imaging results that were available during my care of the patient were reviewed by me and considered in my medical decision making (see chart for details).  Clinical Course as of Mar 28 711  Wed Mar 27, 2017  0650 Informed nursing patient now having chest pain. Heart rate in the 120s. Metoprolol has been ordered but not given. Repeat EKG shows A. fib with RVR.  Given age and risk factors, will admit for formal rule out.  [CH]    Clinical Course User Index [CH] Horton, Mayer Masker, MD    Patient presents with shortness of breath. She is overall nontoxic on exam. Vital signs largely reassuring. She is in atrial fibrillation. She at times has rapid ventricular response into 120s but will come down to 100 fairly quickly. She is not hypoxic. She does not appear volume  overloaded. Denies infectious symptoms including cough or fever. EKG shows no signs of ischemia. Troponin negative. Lab work notable for leukocytosis. Patient was given her morning propranolol as it was noted that her rates began to trend upwards into the 110s to 120s. She was also ordered 1 dose of IV metoprolol. During an episode of faster rate, patient had onset of chest pressure. Repeat EKG showed no signs of ischemia. Suspect her symptoms may be related  to rate. However, given her age and risk factors, will admit for formal chest pain rule out and rate control.    Final Clinical Impressions(s) / ED Diagnoses   Final diagnoses:  SOB (shortness of breath)  Other chest pain  Chronic atrial fibrillation (HCC)   New Prescriptions New Prescriptions   No medications on file   I personally performed the services described in this documentation, which was scribed in my presence. The recorded information has been reviewed and is accurate.     Shon BatonHorton, Courtney F, MD 03/27/17 [phone]

## 2017-03-27 NOTE — Evaluation (Signed)
Physical Therapy Evaluation Patient Details Name: Tanya Little MRN: [id number] DOB: Mar 27, 1932 Today's Date: 03/27/2017   History of Present Illness  Pt is a 83 yo female presenting to the ED for shortness of breath. Upon arrival pt was in atrial fibrillation with no complaints of chest pain, fever, cough, or LE edema. Pt has a medical history significant for essentail tremor with brain stimulator which has not yet been activated, and chronic UTI.   Clinical Impression  Pt presents with limitations with functional mobility secondary to decreased strength and activity tolerance (see PT problem list below). Pt has recent history of medical complications causing decreased independence. Pt was in a SNF for about a month, but continues to have difficulty with mobilization. Pt required min A during gait with 1 HHA due to decreased activity tolerance and stability. Pt had 93% O2 saturation on RA and HR of 83 sitting edge of bed with no complaints of chest pain or shortness of breath. Pt would benefit from continued skilled physical therapy to improve deficits listed below and maximize functional independence. PT will follow acutely.     Follow Up Recommendations Home health PT;Supervision/Assistance - 24 hour    Equipment Recommendations  Rolling walker with 5" wheels    Recommendations for Other Services       Precautions / Restrictions Precautions Precautions: Fall Restrictions Weight Bearing Restrictions: No      Mobility  Bed Mobility Overal bed mobility: Needs Assistance Bed Mobility: Supine to Sit     Supine to sit: Supervision;HOB elevated     General bed mobility comments: Pt required use of bed rails and extra time. Supervision for safety. O2 saturation in sitting was 93% with no reports of respiratory symptoms  Transfers Overall transfer level: Needs assistance Equipment used: 1 person hand held assist Transfers: Sit to/from Stand Sit to Stand: Min assist          General transfer comment: Pt required 1 hand held assistance for sit to stand.   Ambulation/Gait Ambulation/Gait assistance: Mod assist Ambulation Distance (Feet): 30 Feet Assistive device: 1 person hand held assist (wrap around pelvis for extra support) Gait Pattern/deviations: Step-through pattern;Decreased stride length Gait velocity: decreased Gait velocity interpretation: Below normal speed for age/gender General Gait Details: Pt demonstrated decreased stability during gait requiring 1 HHA and wrap around pelvis for support. Pt demonstrated no SOB, but only able to tolerate 58ft of gait due to fatigue.   Stairs            Wheelchair Mobility    Modified Rankin (Stroke Patients Only)       Balance Overall balance assessment: Needs assistance Sitting-balance support: No upper extremity supported;Feet supported Sitting balance-Leahy Scale: Good     Standing balance support: Single extremity supported;During functional activity Standing balance-Leahy Scale: Poor                               Pertinent Vitals/Pain Pain Assessment: No/denies pain    Home Living Family/patient expects to be discharged to:: Private residence Living Arrangements: Spouse/significant other Available Help at Discharge: Family Type of Home: Mobile home Home Access: Stairs to enter Entrance Stairs-Rails: Can reach both Entrance Stairs-Number of Steps: 2 Home Layout: One level Home Equipment: Cane - single point Additional Comments: Pt was staying with sister for a short time to allow for more assistance    Prior Function Level of Independence: Independent with assistive device(s)  Comments: Pt uses a cane for community distances. Pt has required assistance at home for mobility and some house work due to recent medical complications. Pt's husband reports that she has been in SNF for the past month prior to admission.      Hand Dominance         Extremity/Trunk Assessment   Upper Extremity Assessment Upper Extremity Assessment: Defer to OT evaluation    Lower Extremity Assessment Lower Extremity Assessment: Generalized weakness;RLE deficits/detail;LLE deficits/detail RLE Deficits / Details: Decreased BLE strength secondary to decreased mobility within the past month LLE Deficits / Details: Decreased BLE strength secondary to decreased mobility within the past month       Communication   Communication: No difficulties  Cognition Arousal/Alertness: Awake/alert Behavior During Therapy: WFL for tasks assessed/performed Overall Cognitive Status: Within Functional Limits for tasks assessed                                 General Comments: Pt oriented x3. Pt hard of hearing      General Comments General comments (skin integrity, edema, etc.): Pt's husband present during session    Exercises     Assessment/Plan    PT Assessment Patient needs continued PT services  PT Problem List Decreased strength;Decreased range of motion;Decreased activity tolerance;Decreased balance;Decreased mobility;Decreased coordination;Decreased knowledge of use of DME;Decreased safety awareness;Cardiopulmonary status limiting activity       PT Treatment Interventions DME instruction;Gait training;Stair training;Functional mobility training;Therapeutic activities;Therapeutic exercise;Balance training;Patient/family education    PT Goals (Current goals can be found in the Care Plan section)  Acute Rehab PT Goals Patient Stated Goal: to get better PT Goal Formulation: With patient/family Time For Goal Achievement: 04/10/17 Potential to Achieve Goals: Good    Frequency Min 3X/week   Barriers to discharge        Co-evaluation               AM-PAC PT "6 Clicks" Daily Activity  Outcome Measure Difficulty turning over in bed (including adjusting bedclothes, sheets and blankets)?: None Difficulty moving from lying on back  to sitting on the side of the bed? : A Little Difficulty sitting down on and standing up from a chair with arms (e.g., wheelchair, bedside commode, etc,.)?: Total Help needed moving to and from a bed to chair (including a wheelchair)?: A Little Help needed walking in hospital room?: A Little Help needed climbing 3-5 steps with a railing? : A Lot 6 Click Score: 16    End of Session Equipment Utilized During Treatment: Gait belt Activity Tolerance: Patient limited by fatigue (weakness and decreased activity tolerance) Patient left: in bed;with call bell/phone within reach;with bed alarm set;with family/visitor present Nurse Communication: Mobility status PT Visit Diagnosis: Unsteadiness on feet (R26.81);Other abnormalities of gait and mobility (R26.89);Difficulty in walking, not elsewhere classified (R26.2)    Time: [id number] PT Time Calculation (min) (ACUTE ONLY): 16 min   Charges:   PT Evaluation $PT Eval Moderate Complexity: 1 Procedure     PT G CodesCorlis Leak:        Jazier Mcglamery [place], SPT  [phone]  Corlis Leakaylor [place] 03/27/2017, 10:38 AM

## 2017-03-27 NOTE — ED Notes (Addendum)
EDP verbally ordered another EKG due to pt c/o new onset CP.

## 2017-03-27 NOTE — ED Notes (Signed)
Pt ambulated w/ slight difficulty due to not having her cane as usual. O2 sat = 97% and HR = 89 on ambulation.

## 2017-03-27 NOTE — H&P (Signed)
History and Physical    Tanya Little:[id number] DOB: Aug 06, 1932 DOA: 03/27/2017  PCP: Kaleen Mask, MD Patient coming from: home  Chief Complaint: dyspnea/chest pain  HPI: Tanya Little is a very pleasant very HOH 83 y.o. female with medical history significant for atrial fibrillation on Xarelto, essential tremor for which she recently had insertion of brain stimulator that has not been activated, chronic UTI, hyponatremia, esophageal stricture presents to the emergency Department chief complaint sudden onset shortness of breath. Initial evaluation in the emergency department reveals atrial fibrillation with rapid ventricular response.  Information is obtained from the patient and the daughter who is at the bedside. Patient was in her usual state of health until around 2 AM she awakened suddenly with shortness of breath. At the time she denied chest pain palpitations cough fever chills lower extremity edema. She did state that her shortness of breath was worse when lying flat. She denies headache dizziness syncope or near-syncope. Reports compliance with her medications but does indicate that recently her meds were adjusted due to "low blood pressure". She also complains of some chronic constipation. She denies dysuria hematuria frequency or urgency but is finishing up a course of Keflex for urinary tract infection. While in the emergency department she developed chest pain that is resolved on admission. She states the pain is located under her left breast describes it as a "ache or pressure". She denies any radiation of this discomfort. She also complains of soreness behind her left ear chest area where brain stimulator recently inserted.    ED Course: In the emergency department she's afebrile hemodynamically stable with a heart rate up in the 130s at times. She is provided with 5 mg of IV metoprolol given her morning propanolol. At the time of admission rhythm remains atrial  fibrillation but the rate is 83. Patient is chest pain-free. He is nontoxic appearing  Review of Systems: As per HPI otherwise all other systems reviewed and are negative.   Ambulatory Status: Patient typically ambulates with a cane. She currently lives with her daughter needs minimal assist with ADLs. No recent falls  Past Medical History:  Diagnosis Date  . Atrial fibrillation (HCC)   . Barrett's esophagus   . Esophageal stricture   . Essential tremor   . GERD (gastroesophageal reflux disease)   . Hiatal hernia     Past Surgical History:  Procedure Laterality Date  . ABDOMINAL HYSTERECTOMY    . BALLOON DILATION N/A 09/06/2014   Procedure: BALLOON DILATION;  Surgeon: Louis Meckel, MD;  Location: [facility];  Service: Endoscopy;  Laterality: N/A;  . ESOPHAGOGASTRODUODENOSCOPY N/A 09/06/2014   Procedure: ESOPHAGOGASTRODUODENOSCOPY (EGD);  Surgeon: Louis Meckel, MD;  Location: [facility];  Service: Endoscopy;  Laterality: N/A;  . KNEE ARTHROSCOPY Right   . ROTATOR CUFF REPAIR Right     Social History   Social History  . Marital status: Divorced    Spouse name: N/A  . Number of children: 4  . Years of education: N/A   Occupational History  . retired    Social History Main Topics  . Smoking status: Former Smoker    Types: Cigarettes    Quit date: 08/26/2004  . Smokeless tobacco: Never Used  . Alcohol use No  . Drug use: No  . Sexual activity: Not on file   Other Topics Concern  . Not on file   Social History Narrative  . No narrative on file    No Known Allergies  Family  History  Problem Relation Age of Onset  . Diabetes Sister   . Lung cancer Brother   . Cancer Other        nephew  . Aneurysm Brother        x 3    Prior to Admission medications   Medication Sig Start Date End Date Taking? Authorizing Provider  bisacodyl (DULCOLAX) 5 MG EC tablet Take 1 tablet (5 mg total) by mouth daily as needed for moderate constipation. 12/31/16  Yes  Elgergawy, Leana Roe, MD  calcium carbonate (TUMS - DOSED IN MG ELEMENTAL CALCIUM) 500 MG chewable tablet Chew 1 tablet (200 mg of elemental calcium total) by mouth 3 (three) times daily as needed for indigestion or heartburn. 12/31/16  Yes Elgergawy, Leana Roe, MD  cephALEXin (KEFLEX) 500 MG capsule Take 500 mg by mouth 2 (two) times daily.   Yes [provider]  feeding supplement, ENSURE ENLIVE, (ENSURE ENLIVE) LIQD Take 237 mLs by mouth 2 (two) times daily between meals. Patient taking differently: Take 237 mLs by mouth daily as needed (meal replacement).  12/31/16  Yes Elgergawy, Leana Roe, MD  Multiple Vitamins-Minerals (CENTRUM SILVER PO) Take 1 tablet by mouth daily.   Yes [provider]  NEXIUM 40 MG capsule Take 1 capsule (40 mg total) by mouth daily before breakfast. Patient taking differently: Take 40 mg by mouth every other day.  12/05/16  Yes Iva Boop, MD  polyethylene glycol [facility] Ethelene Hal) packet Take 17 g by mouth daily as needed for mild constipation. 12/31/16  Yes Elgergawy, Leana Roe, MD  potassium chloride (K-DUR,KLOR-CON) 10 MEQ tablet Take 10 mEq by mouth daily.   Yes [provider]  propranolol ER (INDERAL LA) 60 MG 24 hr capsule Take 1 capsule (60 mg total) by mouth daily. 12/31/16  Yes Elgergawy, Leana Roe, MD  rivaroxaban (XARELTO) 20 MG TABS tablet Take 20 mg by mouth every morning.    Yes [provider]  senna (SENOKOT) 8.6 MG TABS tablet Take 1 tablet by mouth every morning.   Yes [provider]  traZODone (DESYREL) 150 MG tablet Take 150 mg by mouth at bedtime.   Yes [provider]    Physical Exam: Vitals:   03/27/17 0630 03/27/17 0654 03/27/17 0700 03/27/17 0730  BP: 123/79 123/79 (!) 143/93 135/79  Pulse: (!) 48 98 (!) 57 76  Resp: (!) 23  16 16   Temp:      TempSrc:      SpO2: 100%  98% 98%  Weight:      Height:         General:  Appears calm and comfortable In no acute distress Eyes:  PERRL,  EOMI, normal lids, iris ENT:  grossly normal hearing, lips & tongue, mucous membranes of her mouth slightly dry but pink Neck:  no LAD, masses or thyromegaly Cardiovascular:  Irregularly irregular, no m/r/g. No LE edema. Pulses present and palpable. Chest with healing incision left anterior Respiratory:  CTA bilaterally, no w/r/r. Normal respiratory effort. Abdomen:  soft, ntnd, positive bowel sounds throughout no guarding or rebounding Skin:  No rash or induration. She does have a healing incision left anterior chest no erythema swelling or drainage Musculoskeletal:  grossly normal tone BUE/BLE, good ROM, no bony abnormality Psychiatric:  grossly normal mood and affect, speech fluent and appropriate, AOx3 Neurologic:  CN 2-12 grossly intact, moves all extremities in coordinated fashion, sensation intact. Speech clear facial symmetry  Labs on Admission: I have personally reviewed following  labs and imaging studies  CBC:  Recent Labs Lab 03/27/17 0351  WBC 13.3*  HGB 12.1  HCT 35.4*  MCV 89.6  PLT 302   Basic Metabolic Panel:  Recent Labs Lab 03/27/17 0351  NA 130*  K 4.1  CL 97*  CO2 23  GLUCOSE 112*  BUN 9  CREATININE 0.82  CALCIUM 9.1   GFR: Estimated Creatinine Clearance: 44.6 mL/min (by C-G formula based on SCr of 0.82 mg/dL). Liver Function Tests: No results for input(s): AST, ALT, ALKPHOS, BILITOT, PROT, ALBUMIN in the last 168 hours. No results for input(s): LIPASE, AMYLASE in the last 168 hours. No results for input(s): AMMONIA in the last 168 hours. Coagulation Profile: No results for input(s): INR, PROTIME in the last 168 hours. Cardiac Enzymes: No results for input(s): CKTOTAL, CKMB, CKMBINDEX, TROPONINI in the last 168 hours. BNP (last 3 results) No results for input(s): PROBNP in the last 8760 hours. HbA1C: No results for input(s): HGBA1C in the last 72 hours. CBG: No results for input(s): GLUCAP in the last 168 hours. Lipid Profile: No results for  input(s): CHOL, HDL, LDLCALC, TRIG, CHOLHDL, LDLDIRECT in the last 72 hours. Thyroid Function Tests: No results for input(s): TSH, T4TOTAL, FREET4, T3FREE, THYROIDAB in the last 72 hours. Anemia Panel: No results for input(s): VITAMINB12, FOLATE, FERRITIN, TIBC, IRON, RETICCTPCT in the last 72 hours. Urine analysis:    Component Value Date/Time   COLORURINE AMBER (A) 12/29/2016 0825   APPEARANCEUR CLOUDY (A) 12/29/2016 0825   LABSPEC 1.016 12/29/2016 0825   PHURINE 5.0 12/29/2016 0825   GLUCOSEU NEGATIVE 12/29/2016 0825   HGBUR NEGATIVE 12/29/2016 0825   BILIRUBINUR NEGATIVE 12/29/2016 0825   KETONESUR 20 (A) 12/29/2016 0825   PROTEINUR NEGATIVE 12/29/2016 0825   NITRITE POSITIVE (A) 12/29/2016 0825   LEUKOCYTESUR SMALL (A) 12/29/2016 0825    Creatinine Clearance: Estimated Creatinine Clearance: 44.6 mL/min (by C-G formula based on SCr of 0.82 mg/dL).  Sepsis Labs: @LABRCNTIP (procalcitonin:4,lacticidven:4) )No results found for this or any previous visit (from the past 240 hour(s)).   Radiological Exams on Admission: Dg Chest 2 View  Result Date: 03/27/2017 CLINICAL DATA:  Acute onset of shortness of breath. Initial encounter. EXAM: CHEST  2 VIEW COMPARISON:  Chest radiograph performed 12/28/2016 FINDINGS: The lungs are well-aerated and clear. There is no evidence of focal opacification, pleural effusion or pneumothorax. The heart is normal in size; the mediastinal contour is within normal limits. A metallic device is noted at the left chest wall, with a lead extending into the neck. No acute osseous abnormalities are seen. Chronic right-sided rib deformities are noted. IMPRESSION: No acute cardiopulmonary process seen. Electronically Signed   By: Roanna RaiderJeffery  Chang M.D.   On: 03/27/2017 05:03    EKG: Independently reviewed. Atrial fibrillation Borderline left axis deviation   Assessment/Plan Principal Problem:   Atrial fibrillation with RVR (HCC) Active Problems:   Essential  tremor   Hyponatremia   Acute dyspnea   Chest pain at rest   Recurrent UTI   1. Atrial fibrillation with rapid ventricular response. Etiology maybe related to recent changes in medications due to hypotension. EKG as noted above. Chest x-ray no acute cardiopulmonary process. BNP 145. Rate better controlled after IV metoprolol and home propanolol. [place] score 3. Patient is on Xarelto -Admit to telemetry -Daily weights -Intake and output -Continue home beta blocker -Obtain an echo -Cycle troponins -Continue Xarelto  #2. Chest pain. Atypical. Resolved admission. Initial troponin negative. EKG as noted above. -Cycle troponins -Serial EKG -Echocardiogram -  OP follow up  #3. Acute dyspnea. Likely related to above. Oxygen saturation level greater than 90% on room air. Chest x-ray as noted above. Resolved on admission. -Monitor oxygen saturation level -Oxygen supplementation as indicated  #4. Essential tremor. Recent deep brain stimulator inserted at [facility]. Has not been activated. Appears stable at baseline -Continue home meds  #5. Recurrent UTI. She currently on Keflex she has 2 more days remaining. -Continue Keflex -Outpatient follow-up with PCP as already gotten a culture  #6. Hyponatremia. Sodium level 130. Patient has a history of same. Chart review indicates this is close to baseline.  -gentle IV fluids -monitor    DVT prophylaxis: xarelto  Code Status: dnr  Family Communication: daughter at bedside  Disposition Plan: home. Requested PT eval  Consults called: none Admission status: inpatient    Gwenyth Bender MD [facility]  If 7PM-7AM, please contact night-coverage [email or web address] Password [email or web address]  03/27/2017, 8:01 AM

## 2017-03-27 NOTE — Progress Notes (Addendum)
Initial Nutrition Assessment  DOCUMENTATION CODES:   Severe malnutrition in context of chronic illness, Underweight  INTERVENTION:    Ensure Enlive po TID, each supplement provides 350 kcal and 20 grams of protein  NUTRITION DIAGNOSIS:   Malnutrition (severe) related to  (inadequate oral intake) as evidenced by severe depletion of body fat, severe depletion of muscle mass (family report)  GOAL:   Patient will meet greater than or equal to 90% of their needs  MONITOR:   PO intake, Supplement acceptance, Labs, Weight trends, Skin, I & O's  REASON FOR ASSESSMENT:   Consult Assessment of nutrition requirement/status  ASSESSMENT:   83 yo Female presenting to the ED for shortness of breath. Upon arrival pt was in atrial fibrillation with no complaints of chest pain, fever, cough, or LE edema. Pt has a medical history significant for essentail tremor with brain stimulator which has not yet been activated, and chronic UTI.   Pt not answering RD interview questions.  Asking "what's going on?" and "where's my son?" Per nutrition screen, pt eating poorly because of a decreased appetite.  Per chart review, pt lives with her daughter. No % PO intake records available per flowsheets.  Lunch meal untouched on tray table. Medications reviewed.  Labs reviewed.  Sodium 130 (L). Ensure Enlive ordered PRN.  Will change to TID.  Limited Nutrition-Focused physical exam completed.  Findings are severe fat depletion, severe muscle depletion, and no edema.   Addendum:  Spoke with pt's daughter, Steward DroneBrenda via telephone.  States pt's PO intake is poor at home.  Sometimes pt will drink Ensure supplements.  No swallowing and/or chewing issues reported.  Daughter also revealed pt has been having progressive memory loss.  Diet Order:  Diet Heart Room service appropriate? Yes; Fluid consistency: Thin  Skin:  Reviewed, no issues  Last BM:  N/A  Height:   Ht Readings from Last 1 Encounters:  03/27/17  5\' 10"  (1.778 m)   Weight:   Wt Readings from Last 1 Encounters:  03/27/17 120 lb (54.4 kg)   Ideal Body Weight:  68.1 kg  BMI:  Body mass index is 17.22 kg/m.  Estimated Nutritional Needs:   Kcal:  1350-1550  Protein:  65-75 gm  Fluid:  >/= 1.5 L  EDUCATION NEEDS:   No education needs identified at this time  Maureen ChattersKatie Jazmin Vensel, RD, LDN Pager #: [phone] After-Hours Pager #: [phone]

## 2017-03-28 ENCOUNTER — Inpatient Hospital Stay (HOSPITAL_BASED_OUTPATIENT_CLINIC_OR_DEPARTMENT_OTHER): Payer: Medicare Other

## 2017-03-28 DIAGNOSIS — I4891 Unspecified atrial fibrillation: Secondary | ICD-10-CM | POA: Diagnosis present

## 2017-03-28 DIAGNOSIS — E43 Unspecified severe protein-calorie malnutrition: Secondary | ICD-10-CM

## 2017-03-28 DIAGNOSIS — R0600 Dyspnea, unspecified: Secondary | ICD-10-CM

## 2017-03-28 DIAGNOSIS — I340 Nonrheumatic mitral (valve) insufficiency: Secondary | ICD-10-CM

## 2017-03-28 DIAGNOSIS — G250 Essential tremor: Secondary | ICD-10-CM | POA: Diagnosis not present

## 2017-03-28 DIAGNOSIS — R079 Chest pain, unspecified: Secondary | ICD-10-CM | POA: Diagnosis not present

## 2017-03-28 LAB — CBC
HCT: 33.6 % — ABNORMAL LOW (ref 36.0–46.0)
HEMOGLOBIN: 11 g/dL — AB (ref 12.0–15.0)
MCH: 29.6 pg (ref 26.0–34.0)
MCHC: 32.7 g/dL (ref 30.0–36.0)
MCV: 90.3 fL (ref 78.0–100.0)
Platelets: 310 10*3/uL (ref 150–400)
RBC: 3.72 MIL/uL — AB (ref 3.87–5.11)
RDW: 12.8 % (ref 11.5–15.5)
WBC: 7.8 10*3/uL (ref 4.0–10.5)

## 2017-03-28 LAB — BASIC METABOLIC PANEL
ANION GAP: 10 (ref 5–15)
BUN: 13 mg/dL (ref 6–20)
CHLORIDE: 99 mmol/L — AB (ref 101–111)
CO2: 24 mmol/L (ref 22–32)
Calcium: 8.8 mg/dL — ABNORMAL LOW (ref 8.9–10.3)
Creatinine, Ser: 0.8 mg/dL (ref 0.44–1.00)
GFR calc non Af Amer: >60 mL/min (ref >60)
Glucose, Bld: 94 mg/dL (ref 65–99)
Potassium: 3.9 mmol/L (ref 3.5–5.1)
Sodium: 133 mmol/L — ABNORMAL LOW (ref 135–145)

## 2017-03-28 LAB — LIPID PANEL
CHOL/HDL RATIO: 4.2 ratio
CHOLESTEROL: 186 mg/dL (ref 0–200)
HDL: 44 mg/dL (ref >40)
LDL Cholesterol: 108 mg/dL — ABNORMAL HIGH (ref 0–99)
Triglycerides: 170 mg/dL — ABNORMAL HIGH (ref <150)
VLDL: 34 mg/dL (ref 0–40)

## 2017-03-28 LAB — ECHOCARDIOGRAM COMPLETE
Height: 70 in
Weight: 1908.8 oz

## 2017-03-28 LAB — TROPONIN I

## 2017-03-28 NOTE — Care Management Obs Status (Signed)
MEDICARE OBSERVATION STATUS NOTIFICATION   Patient Details  Name: Tanya Little MRN: [id number] Date of Birth: 1932-10-09   Medicare Observation Status Notification Given:  Yes    Darrold SpanWebster, Keli Buehner Hall, RN 03/28/2017, 4:00 PM

## 2017-03-28 NOTE — Care Management CC44 (Signed)
Condition Code 44 Documentation Completed  Patient Details  Name: Barron AlvineBetty G Girvan MRN: [id number] Date of Birth: 1931-12-03   Condition Code 44 given:  Yes Patient signature on Condition Code 44 notice:  Yes Documentation of 2 MD's agreement:  Yes Code 44 added to claim:  Yes    Darrold SpanWebster, Sela Falk Hall, RN 03/28/2017, 4:00 PM

## 2017-03-28 NOTE — Progress Notes (Signed)
PROGRESS NOTE    Tanya Little  ZOX:[id number] DOB: 09/18/1932 DOA: 03/27/2017 PCP: Kaleen MaskElkins, Wilson Oliver, MD    Brief Narrative:   Tanya AlvineBetty G Little is a very pleasant very HOH 83 y.o. female with medical history significant for atrial fibrillation on Xarelto, essential tremor for which she recently had insertion of brain stimulator that has not been activated, chronic UTI, hyponatremia, esophageal stricture presents to the emergency Department chief complaint sudden onset shortness of breath. Initial evaluation in the emergency department reveals atrial fibrillation with rapid ventricular response.  Information is obtained from the patient and the daughter who is at the bedside. Patient was in her usual state of health until around 2 AM she awakened suddenly with shortness of breath. At the time she denied chest pain palpitations cough fever chills lower extremity edema. She did state that her shortness of breath was worse when lying flat. She denies headache dizziness syncope or near-syncope. Reports compliance with her medications but does indicate that recently her meds were adjusted due to "low blood pressure". She also complains of some chronic constipation. She denies dysuria hematuria frequency or urgency but is finishing up a course of Keflex for urinary tract infection. While in the emergency department she developed chest pain that is resolved on admission. She states the pain is located under her left breast describes it as a "ache or pressure". She denies any radiation of this discomfort. She also complains of soreness behind her left ear chest area where brain stimulator recently inserted.    ED Course: In the emergency department she's afebrile hemodynamically stable with a heart rate up in the 130s at times. She is provided with 5 mg of IV metoprolol given her morning propanolol. At the time of admission rhythm remains atrial fibrillation but the rate is 83. Patient is chest pain-free.  He is nontoxic appearing   Assessment & Plan:   Principal Problem:   Atrial fibrillation with RVR (HCC) Active Problems:   Essential tremor   Hyponatremia   Acute dyspnea   Chest pain at rest   Recurrent UTI   Protein-calorie malnutrition, severe  #1 Atrial fibrillation with RVR CHA2DS2VASC ?? Etiology. Patient being treated for UTI. CXR negative. EKG with no ischemic changes. Patient was given some IV metoprolol in the ED and placed back on a home regimen of propranolol with improvement in heart rate. Patient noted to have a heart rates in the 120s to the 140s this morning which have since improved. Cycle cardiac enzymes every 6 hours 2 more. 2-D echo with EF = 55%, with no WMA. Continue atenolol and titrate as needed for better rate control. Continue Xarelto for anticoagulation. Monitor over the next 24 hours to assure better rate control. Outpatient follow-up.  #2 recurrent recent UTI Continue home regimen of antibiotics prior to admission.  #3 severe protein calorie malnutrition Additional supplementation.  #4 gastroesophageal reflux disease PPI.  #5 acute dyspnea Likely secondary to problem #1. Clinical improvement. Chest x-ray negative for any acute abnormalities.  #6 hyponatremia Improved.  #7 essential tremor Status post recent brain stimulator placement. Outpatient follow-up.    DVT prophylaxis: xarelto Code Status: DNR Family Communication: Updated family at bedside. Disposition Plan: Home once HR controlled no no further RVR, hopefully tomorrow.   Consultants:   None  Procedures:   2 echo 03/28/2017  CXR 03/27/2017  Antimicrobials:   None   Subjective: Patient states SOB improving. No CP. Patient noted to have heart rates in the 130s to the 140s  this morning.  Objective: Vitals:   03/27/17 1348 03/27/17 1940 03/27/17 2333 03/28/17 0410  BP: (!) 112/55 135/75 113/89 120/63  Pulse: 69 75 76 72  Resp: 18 18 18 18   Temp: 98 F (36.7 C) 97.7  F (36.5 C) 97.9 F (36.6 C) 98.1 F (36.7 C)  TempSrc: Oral Oral Oral Oral  SpO2: 96% 98% 99% 98%  Weight:    54.1 kg (119 lb 4.8 oz)  Height:        Intake/Output Summary (Last 24 hours) at 03/28/17 1021 Last data filed at 03/28/17 0643  Gross per 24 hour  Intake                0 ml  Output                2 ml  Net               -2 ml   Filed Weights   03/27/17 0322 03/28/17 0410  Weight: 54.4 kg (120 lb) 54.1 kg (119 lb 4.8 oz)    Examination:  General exam: Appears calm and comfortable  Respiratory system: Clear to auscultation. Respiratory effort normal. Cardiovascular system: S1 & S2 heard, RRR. No JVD, murmurs, rubs, gallops or clicks. No pedal edema. Gastrointestinal system: Abdomen is nondistended, soft and nontender. No organomegaly or masses felt. Normal bowel sounds heard. Central nervous system: Alert and oriented. No focal neurological deficits. Extremities: Symmetric 5 x 5 power. Skin: No rashes, lesions or ulcers Psychiatry: Judgement and insight appear normal. Mood & affect appropriate.     Data Reviewed: I have personally reviewed following labs and imaging studies  CBC:  Recent Labs Lab 03/27/17 0351 03/28/17 0222  WBC 13.3* 7.8  HGB 12.1 11.0*  HCT 35.4* 33.6*  MCV 89.6 90.3  PLT 302 310   Basic Metabolic Panel:  Recent Labs Lab 03/27/17 0351 03/28/17 0222  NA 130* 133*  K 4.1 3.9  CL 97* 99*  CO2 23 24  GLUCOSE 112* 94  BUN 9 13  CREATININE 0.82 0.80  CALCIUM 9.1 8.8*   GFR: Estimated Creatinine Clearance: 45.5 mL/min (by C-G formula based on SCr of 0.8 mg/dL). Liver Function Tests: No results for input(s): AST, ALT, ALKPHOS, BILITOT, PROT, ALBUMIN in the last 168 hours. No results for input(s): LIPASE, AMYLASE in the last 168 hours. No results for input(s): AMMONIA in the last 168 hours. Coagulation Profile: No results for input(s): INR, PROTIME in the last 168 hours. Cardiac Enzymes:  Recent Labs Lab 03/27/17 0841    TROPONINI 0.06*   BNP (last 3 results) No results for input(s): PROBNP in the last 8760 hours. HbA1C: No results for input(s): HGBA1C in the last 72 hours. CBG: No results for input(s): GLUCAP in the last 168 hours. Lipid Profile:  Recent Labs  03/28/17 0222  CHOL 186  HDL 44  LDLCALC 108*  TRIG 170*  CHOLHDL 4.2   Thyroid Function Tests:  Recent Labs  03/27/17 0841  TSH 2.395   Anemia Panel: No results for input(s): VITAMINB12, FOLATE, FERRITIN, TIBC, IRON, RETICCTPCT in the last 72 hours. Sepsis Labs: No results for input(s): PROCALCITON, LATICACIDVEN in the last 168 hours.  No results found for this or any previous visit (from the past 240 hour(s)).       Radiology Studies: Dg Chest 2 View  Result Date: 03/27/2017 CLINICAL DATA:  Acute onset of shortness of breath. Initial encounter. EXAM: CHEST  2 VIEW COMPARISON:  Chest radiograph performed 12/28/2016  FINDINGS: The lungs are well-aerated and clear. There is no evidence of focal opacification, pleural effusion or pneumothorax. The heart is normal in size; the mediastinal contour is within normal limits. A metallic device is noted at the left chest wall, with a lead extending into the neck. No acute osseous abnormalities are seen. Chronic right-sided rib deformities are noted. IMPRESSION: No acute cardiopulmonary process seen. Electronically Signed   By: Roanna Raider M.D.   On: 03/27/2017 05:03        Scheduled Meds: . cephALEXin  500 mg Oral BID  . feeding supplement (ENSURE ENLIVE)  237 mL Oral TID BM  . pantoprazole  40 mg Oral Daily  . propranolol ER  60 mg Oral Daily  . rivaroxaban  20 mg Oral Q breakfast  . senna  1 tablet Oral [id number]  . traZODone  150 mg Oral QHS   Continuous Infusions:   LOS: 1 day    Time spent: 35 mins    Lizzet Hendley, MD [facility] Pager [phone]  If 7PM-7AM, please contact night-coverage [email or web address] Password [facility] 03/28/2017, 10:21 AM

## 2017-03-28 NOTE — Progress Notes (Signed)
  Echocardiogram 2D Echocardiogram has been performed.  Tanya Little T Ayrton Mcvay 03/28/2017, 2:15 PM

## 2017-03-29 DIAGNOSIS — I482 Chronic atrial fibrillation, unspecified: Secondary | ICD-10-CM

## 2017-03-29 DIAGNOSIS — I4891 Unspecified atrial fibrillation: Secondary | ICD-10-CM | POA: Diagnosis not present

## 2017-03-29 DIAGNOSIS — R0600 Dyspnea, unspecified: Secondary | ICD-10-CM | POA: Diagnosis not present

## 2017-03-29 DIAGNOSIS — R0789 Other chest pain: Secondary | ICD-10-CM | POA: Diagnosis not present

## 2017-03-29 DIAGNOSIS — K5900 Constipation, unspecified: Secondary | ICD-10-CM | POA: Diagnosis present

## 2017-03-29 DIAGNOSIS — E871 Hypo-osmolality and hyponatremia: Secondary | ICD-10-CM | POA: Diagnosis not present

## 2017-03-29 DIAGNOSIS — G250 Essential tremor: Secondary | ICD-10-CM | POA: Diagnosis not present

## 2017-03-29 DIAGNOSIS — R079 Chest pain, unspecified: Secondary | ICD-10-CM | POA: Diagnosis not present

## 2017-03-29 LAB — BASIC METABOLIC PANEL
ANION GAP: 7 (ref 5–15)
BUN: 13 mg/dL (ref 6–20)
CHLORIDE: 98 mmol/L — AB (ref 101–111)
CO2: 24 mmol/L (ref 22–32)
Calcium: 8.8 mg/dL — ABNORMAL LOW (ref 8.9–10.3)
Creatinine, Ser: 0.81 mg/dL (ref 0.44–1.00)
GFR calc non Af Amer: >60 mL/min (ref >60)
Glucose, Bld: 104 mg/dL — ABNORMAL HIGH (ref 65–99)
POTASSIUM: 4 mmol/L (ref 3.5–5.1)
SODIUM: 129 mmol/L — AB (ref 135–145)

## 2017-03-29 LAB — CBC WITH DIFFERENTIAL/PLATELET
Basophils Absolute: 0 10*3/uL (ref 0.0–0.1)
Basophils Relative: 0 %
EOS ABS: 0.3 10*3/uL (ref 0.0–0.7)
Eosinophils Relative: 4 %
HCT: 32.8 % — ABNORMAL LOW (ref 36.0–46.0)
HEMOGLOBIN: 10.6 g/dL — AB (ref 12.0–15.0)
LYMPHS PCT: 25 %
Lymphs Abs: 1.9 10*3/uL (ref 0.7–4.0)
MCH: 29.3 pg (ref 26.0–34.0)
MCHC: 32.3 g/dL (ref 30.0–36.0)
MCV: 90.6 fL (ref 78.0–100.0)
Monocytes Absolute: 0.7 10*3/uL (ref 0.1–1.0)
Monocytes Relative: 9 %
NEUTROS PCT: 62 %
Neutro Abs: 4.7 10*3/uL (ref 1.7–7.7)
Platelets: 272 10*3/uL (ref 150–400)
RBC: 3.62 MIL/uL — AB (ref 3.87–5.11)
RDW: 13 % (ref 11.5–15.5)
WBC: 7.6 10*3/uL (ref 4.0–10.5)

## 2017-03-29 MED ORDER — RIVAROXABAN 15 MG PO TABS
15.0000 mg | ORAL_TABLET | Freq: Every day | ORAL | Status: DC
  Administered 2017-03-30: 15 mg via ORAL
  Filled 2017-03-29: qty 1

## 2017-03-29 MED ORDER — PROPRANOLOL HCL 10 MG PO TABS
10.0000 mg | ORAL_TABLET | ORAL | Status: AC
Start: 2017-03-29 — End: 2017-03-29
  Administered 2017-03-29: 10 mg via ORAL
  Filled 2017-03-29 (×2): qty 1

## 2017-03-29 MED ORDER — SODIUM CHLORIDE 0.9 % IV SOLN
INTRAVENOUS | Status: DC
  Administered 2017-03-30: via INTRAVENOUS

## 2017-03-29 MED ORDER — SORBITOL 70 % SOLN
960.0000 mL | TOPICAL_OIL | Freq: Once | ORAL | Status: AC
  Administered 2017-03-29: 960 mL via RECTAL
  Filled 2017-03-29: qty 240

## 2017-03-29 MED ORDER — PROPRANOLOL HCL ER 80 MG PO CP24
80.0000 mg | ORAL_CAPSULE | Freq: Every day | ORAL | Status: DC
  Administered 2017-03-30: 80 mg via ORAL
  Filled 2017-03-29: qty 1

## 2017-03-29 MED ORDER — POLYETHYLENE GLYCOL 3350 17 G PO PACK
17.0000 g | PACK | Freq: Two times a day (BID) | ORAL | Status: DC
  Administered 2017-03-29 – 2017-03-30 (×3): 17 g via ORAL
  Filled 2017-03-29 (×3): qty 1

## 2017-03-29 MED ORDER — LIDOCAINE HCL 2 % EX GEL
1.0000 "application " | Freq: Once | CUTANEOUS | Status: DC
  Filled 2017-03-29: qty 5

## 2017-03-29 NOTE — Progress Notes (Signed)
PROGRESS NOTE    Tanya Little  ZOX:[id number] DOB: 31-Aug-1932 DOA: 03/27/2017 PCP: Kaleen Mask, MD    Brief Narrative:   Tanya Little is a very pleasant very HOH 83 y.o. female with medical history significant for atrial fibrillation on Xarelto, essential tremor for which she recently had insertion of brain stimulator that has not been activated, chronic UTI, hyponatremia, esophageal stricture presents to the emergency Department chief complaint sudden onset shortness of breath. Initial evaluation in the emergency department reveals atrial fibrillation with rapid ventricular response.  Information is obtained from the patient and the daughter who is at the bedside. Patient was in her usual state of health until around 2 AM she awakened suddenly with shortness of breath. At the time she denied chest pain palpitations cough fever chills lower extremity edema. She did state that her shortness of breath was worse when lying flat. She denies headache dizziness syncope or near-syncope. Reports compliance with her medications but does indicate that recently her meds were adjusted due to "low blood pressure". She also complains of some chronic constipation. She denies dysuria hematuria frequency or urgency but is finishing up a course of Keflex for urinary tract infection. While in the emergency department she developed chest pain that is resolved on admission. She states the pain is located under her left breast describes it as a "ache or pressure". She denies any radiation of this discomfort. She also complains of soreness behind her left ear chest area where brain stimulator recently inserted.    ED Course: In the emergency department she's afebrile hemodynamically stable with a heart rate up in the 130s at times. She is provided with 5 mg of IV metoprolol given her morning propanolol. At the time of admission rhythm remains atrial fibrillation but the rate is 83. Patient is chest pain-free.  He is nontoxic appearing   Assessment & Plan:   Principal Problem:   Atrial fibrillation with RVR (HCC) Active Problems:   Essential tremor   Hyponatremia   Acute dyspnea   Chest pain at rest   Recurrent UTI   Protein-calorie malnutrition, severe   Atrial fibrillation with rapid ventricular response (HCC)   Constipation  #1 Atrial fibrillation with RVR CHA2DS2VASC ?? Etiology. Patient being treated for UTI. CXR negative. EKG with no ischemic changes. Patient was given some IV metoprolol in the ED and placed back on a home regimen of propranolol with improvement in heart rate. Patient noted to have a heart rates in the 120s to the 150s this morning while on the commode and straining. Heart rate improved into the 80s when back in bed. Cardiac enzymes negative 2. 2-D echo with EF = 55%, with no WMA. Patient already given a dose of propranolol ER 60 mg daily. Will give propranolol immediate release 10 mg every 4 hours 2 doses and increase home regimen to propranolol ER 80 mg daily. Ambulate in hallway and recorded heart rates. Continue Xarelto for anticoagulation. Monitor over the next 24 hours to assure better rate control. Outpatient follow-up.  #2 recurrent recent UTI Continue home regimen of antibiotics prior to admission.  #3 severe protein calorie malnutrition Additional supplementation.  #4 gastroesophageal reflux disease PPI.  #5 acute dyspnea Likely secondary to problem #1. Clinical improvement. Chest x-ray negative for any acute abnormalities.  #6 hyponatremia Place on IV fluids. Follow.  #7 essential tremor Status post recent brain stimulator placement. Outpatient follow-up.  #8 constipation Soapsuds enema 1. May repeat if no results.  DVT prophylaxis: xarelto Code Status: DNR Family Communication: No family at bedside. Disposition Plan: Home once HR controlled no no further RVR, hopefully tomorrow.   Consultants:   None  Procedures:   2 echo  03/28/2017  CXR 03/27/2017  Antimicrobials:   None   Subjective: Patient sitting on bedside commode straining complaining of constipation. No shortness of breath. No chest pain. Palpitations. Heart rate on telemetry noted to go into the 140s to the 150s.   Objective: Vitals:   03/28/17 1210 03/28/17 2042 03/29/17 0622 03/29/17 0842  BP: 126/84 126/83 (!) 116/56   Pulse: 82 75 77 (!) 140  Resp: 17 18 18    Temp: 97.7 F (36.5 C) 97.7 F (36.5 C) 98.2 F (36.8 C)   TempSrc: Oral Oral Oral   SpO2: 97% 98% 98%   Weight:   54 kg (119 lb 1.6 oz)   Height:        Intake/Output Summary (Last 24 hours) at 03/29/17 0934 Last data filed at 03/28/17 1807  Gross per 24 hour  Intake              480 ml  Output                0 ml  Net              480 ml   Filed Weights   03/27/17 0322 03/28/17 0410 03/29/17 0622  Weight: 54.4 kg (120 lb) 54.1 kg (119 lb 4.8 oz) 54 kg (119 lb 1.6 oz)    Examination:  General exam: Sitting on commode Respiratory system: Clear to auscultation. Respiratory effort normal. Cardiovascular system: Irregularly irregular. No JVD, murmurs, rubs, gallops or clicks. No pedal edema. Gastrointestinal system: Abdomen is nondistended, soft and nontender. No organomegaly or masses felt. Normal bowel sounds heard. Central nervous system: Alert and oriented. No focal neurological deficits. Extremities: Symmetric 5 x 5 power. Skin: No rashes, lesions or ulcers Psychiatry: Judgement and insight appear normal. Mood & affect appropriate.     Data Reviewed: I have personally reviewed following labs and imaging studies  CBC:  Recent Labs Lab 03/27/17 0351 03/28/17 0222 03/29/17 0236  WBC 13.3* 7.8 7.6  NEUTROABS  --   --  4.7  HGB 12.1 11.0* 10.6*  HCT 35.4* 33.6* 32.8*  MCV 89.6 90.3 90.6  PLT 302 310 272   Basic Metabolic Panel:  Recent Labs Lab 03/27/17 0351 03/28/17 0222 03/29/17 0236  NA 130* 133* 129*  K 4.1 3.9 4.0  CL 97* 99* 98*  CO2 23  24 24   GLUCOSE 112* 94 104*  BUN 9 13 13   CREATININE 0.82 0.80 0.81  CALCIUM 9.1 8.8* 8.8*   GFR: Estimated Creatinine Clearance: 44.9 mL/min (by C-G formula based on SCr of 0.81 mg/dL). Liver Function Tests: No results for input(s): AST, ALT, ALKPHOS, BILITOT, PROT, ALBUMIN in the last 168 hours. No results for input(s): LIPASE, AMYLASE in the last 168 hours. No results for input(s): AMMONIA in the last 168 hours. Coagulation Profile: No results for input(s): INR, PROTIME in the last 168 hours. Cardiac Enzymes:  Recent Labs Lab 03/27/17 0841 03/28/17 1446 03/28/17 2119  TROPONINI 0.06* <0.03 <0.03   BNP (last 3 results) No results for input(s): PROBNP in the last 8760 hours. HbA1C: No results for input(s): HGBA1C in the last 72 hours. CBG: No results for input(s): GLUCAP in the last 168 hours. Lipid Profile:  Recent Labs  03/28/17 0222  CHOL 186  HDL 44  LDLCALC  108*  TRIG 170*  CHOLHDL 4.2   Thyroid Function Tests:  Recent Labs  03/27/17 0841  TSH 2.395   Anemia Panel: No results for input(s): VITAMINB12, FOLATE, FERRITIN, TIBC, IRON, RETICCTPCT in the last 72 hours. Sepsis Labs: No results for input(s): PROCALCITON, LATICACIDVEN in the last 168 hours.  No results found for this or any previous visit (from the past 240 hour(s)).       Radiology Studies: No results found.      Scheduled Meds: . cephALEXin  500 mg Oral BID  . feeding supplement (ENSURE ENLIVE)  237 mL Oral TID BM  . pantoprazole  40 mg Oral Daily  . propranolol  10 mg Oral Q4H  . [START ON 03/30/2017] propranolol ER  80 mg Oral Daily  . rivaroxaban  20 mg Oral Q breakfast  . senna  1 tablet Oral [id number]  . traZODone  150 mg Oral QHS   Continuous Infusions: . sodium chloride       LOS: 1 day    Time spent: 40 mins    Eithan Beagle, MD [facility] Pager [phone]  If 7PM-7AM, please contact night-coverage [email or web address] Password [email or web address] 03/29/2017, 9:34  AM

## 2017-03-29 NOTE — Progress Notes (Signed)
Physical Therapy Treatment Patient Details Name: Tanya Little MRN: [id number] DOB: 05-13-1932 Today's Date: 03/29/2017    History of Present Illness Pt is a 83 yo female presenting to the ED for shortness of breath. Upon arrival pt was in atrial fibrillation with no complaints of chest pain, fever, cough, or LE edema. Pt has a medical history significant for essentail tremor with brain stimulator which has not yet been activated, and chronic UTI.     PT Comments    Pt agreeable to participate in PT session.  Per RN, is impacted.  Daughter present entire session.  States pt not doing as well today as yesterday but contributes it to being impacted.  Pt ambulated 60' using RW with min guard.  Reports generalized fatigue.  Advised use of RW for mobility at this time to increase safety due to weakness.  Pt/daughter verbalize understanding.  Also encouraged ambulating 2-3 times a day with Nsing while here in the hospital.  Will cont to follow acutely to maximize functional mobility prior to d/c home.      Follow Up Recommendations  Home health PT;Supervision/Assistance - 24 hour     Equipment Recommendations  Rolling walker with 5" wheels    Recommendations for Other Services       Precautions / Restrictions Precautions Precautions: Fall Restrictions Weight Bearing Restrictions: No    Mobility  Bed Mobility Overal bed mobility: Needs Assistance Bed Mobility: Supine to Sit;Sit to Supine     Supine to sit: Supervision;HOB elevated Sit to supine: Supervision (HOB flat)   General bed mobility comments: incr time but able to complete without physical assist  Transfers Overall transfer level: Needs assistance Equipment used: Rolling walker (2 wheeled) Transfers: Sit to/from Stand Sit to Stand: Min guard         General transfer comment: cues for hand placement  Ambulation/Gait Ambulation/Gait assistance: Min guard Ambulation Distance (Feet): 60 Feet Assistive device: Rolling  walker (2 wheeled) Gait Pattern/deviations: Step-through pattern;Decreased stride length Gait velocity: decreased   General Gait Details: guarding for safety.  reports generalized fatigue.     Stairs            Wheelchair Mobility    Modified Rankin (Stroke Patients Only)       Balance                                            Cognition Arousal/Alertness: Awake/alert Behavior During Therapy: WFL for tasks assessed/performed Overall Cognitive Status: Within Functional Limits for tasks assessed                                 General Comments: HOH      Exercises      General Comments General comments (skin integrity, edema, etc.): pts daughter present during visit      Pertinent Vitals/Pain Pain Assessment: No/denies pain    Home Living                      Prior Function            PT Goals (current goals can now be found in the care plan section) Acute Rehab PT Goals Patient Stated Goal: to get better PT Goal Formulation: With patient/family Time For Goal Achievement: 04/10/17 Potential to Achieve Goals: Good Progress towards  PT goals: Progressing toward goals    Frequency    Min 3X/week      PT Plan Current plan remains appropriate    Co-evaluation              AM-PAC PT "6 Clicks" Daily Activity  Outcome Measure                   End of Session Equipment Utilized During Treatment: Gait belt Activity Tolerance: Patient tolerated treatment well Patient left: in bed;with call bell/phone within reach;with family/visitor present Nurse Communication: Mobility status PT Visit Diagnosis: Unsteadiness on feet (R26.81);Other abnormalities of gait and mobility (R26.89);Difficulty in walking, not elsewhere classified (R26.2)     Time: [id number] PT Time Calculation (min) (ACUTE ONLY): 29 min  Charges:  $Gait Training: 23-37 mins $Therapeutic Activity: 8-22 mins                    G  CodesVerdell Face:       Trinady Milewski, VirginiaPTA [phone] 03/29/2017    Lara Mulchooper, Kalis Friese Lynn 03/29/2017, 3:43 PM

## 2017-03-30 DIAGNOSIS — G250 Essential tremor: Secondary | ICD-10-CM | POA: Diagnosis not present

## 2017-03-30 DIAGNOSIS — E871 Hypo-osmolality and hyponatremia: Secondary | ICD-10-CM | POA: Diagnosis not present

## 2017-03-30 DIAGNOSIS — K5649 Other impaction of intestine: Secondary | ICD-10-CM | POA: Diagnosis present

## 2017-03-30 DIAGNOSIS — I4891 Unspecified atrial fibrillation: Secondary | ICD-10-CM | POA: Diagnosis not present

## 2017-03-30 DIAGNOSIS — K5900 Constipation, unspecified: Secondary | ICD-10-CM | POA: Diagnosis not present

## 2017-03-30 LAB — CBC WITH DIFFERENTIAL/PLATELET
Basophils Absolute: 0 10*3/uL (ref 0.0–0.1)
Basophils Relative: 0 %
EOS PCT: 2 %
Eosinophils Absolute: 0.2 10*3/uL (ref 0.0–0.7)
HCT: 31.4 % — ABNORMAL LOW (ref 36.0–46.0)
Hemoglobin: 10.4 g/dL — ABNORMAL LOW (ref 12.0–15.0)
LYMPHS PCT: 13 %
Lymphs Abs: 1.4 10*3/uL (ref 0.7–4.0)
MCH: 29.9 pg (ref 26.0–34.0)
MCHC: 33.1 g/dL (ref 30.0–36.0)
MCV: 90.2 fL (ref 78.0–100.0)
MONO ABS: 0.6 10*3/uL (ref 0.1–1.0)
MONOS PCT: 6 %
Neutro Abs: 8.4 10*3/uL — ABNORMAL HIGH (ref 1.7–7.7)
Neutrophils Relative %: 79 %
PLATELETS: 244 10*3/uL (ref 150–400)
RBC: 3.48 MIL/uL — ABNORMAL LOW (ref 3.87–5.11)
RDW: 13 % (ref 11.5–15.5)
WBC: 10.6 10*3/uL — ABNORMAL HIGH (ref 4.0–10.5)

## 2017-03-30 LAB — BASIC METABOLIC PANEL
Anion gap: 6 (ref 5–15)
BUN: 10 mg/dL (ref 6–20)
CO2: 23 mmol/L (ref 22–32)
CREATININE: 0.72 mg/dL (ref 0.44–1.00)
Calcium: 8.5 mg/dL — ABNORMAL LOW (ref 8.9–10.3)
Chloride: 102 mmol/L (ref 101–111)
GFR calc Af Amer: >60 mL/min (ref >60)
GLUCOSE: 112 mg/dL — AB (ref 65–99)
Potassium: 4.1 mmol/L (ref 3.5–5.1)
SODIUM: 131 mmol/L — AB (ref 135–145)

## 2017-03-30 LAB — MAGNESIUM: MAGNESIUM: 1.7 mg/dL (ref 1.7–2.4)

## 2017-03-30 MED ORDER — PROPRANOLOL HCL ER 80 MG PO CP24: 80.0000 mg | ORAL_CAPSULE | Freq: Every day | ORAL | 1 refills | Status: AC

## 2017-03-30 MED ORDER — RIVAROXABAN 15 MG PO TABS: 15.0000 mg | ORAL_TABLET | Freq: Every day | ORAL | 1 refills | Status: AC

## 2017-03-30 MED ORDER — MAGNESIUM SULFATE 4 GM/100ML IV SOLN
4.0000 g | Freq: Once | INTRAVENOUS | Status: DC
  Filled 2017-03-30: qty 100

## 2017-03-30 MED ORDER — SENNA 8.6 MG PO TABS: 1.0000 | ORAL_TABLET | Freq: Two times a day (BID) | ORAL | 0 refills | Status: AC

## 2017-03-30 MED ORDER — POLYETHYLENE GLYCOL 3350 17 G PO PACK: 17.0000 g | PACK | Freq: Two times a day (BID) | ORAL | 0 refills | Status: AC

## 2017-03-30 MED ORDER — SENNA 8.6 MG PO TABS
1.0000 | ORAL_TABLET | Freq: Two times a day (BID) | ORAL | Status: DC
  Administered 2017-03-30: 8.6 mg via ORAL
  Filled 2017-03-30: qty 1

## 2017-03-30 MED ORDER — SORBITOL 70 % SOLN
960.0000 mL | TOPICAL_OIL | Freq: Once | ORAL | Status: DC
  Filled 2017-03-30: qty 240

## 2017-03-30 NOTE — Discharge Summary (Signed)
Physician Discharge Summary  Barron AlvineBetty G Leather ZOX:[id number] DOB: [birth year] DOA: 03/27/2017  PCP: Kaleen MaskElkins, Wilson Oliver, MD  Admit date: 03/27/2017 Discharge date: 03/30/2017  Time spent: 65 minutes  Recommendations for Outpatient Follow-up:  1. Follow-up with Kaleen MaskElkins, Wilson Oliver, MD in 2 weeks. On follow-up patient will need a basic metabolic profile to follow-up on electrolytes and renal function. Patient afebrile need to be reassessed as patient's propranolol dose was increased to 80 mg daily. Patient's constipation will need to be reassessed patient was discharged on a bowel regimen.   Discharge Diagnoses:  Principal Problem:   Atrial fibrillation with RVR (HCC) Active Problems:   Essential tremor   Hyponatremia   Acute dyspnea   Chest pain at rest   Recurrent UTI   Protein-calorie malnutrition, severe   Atrial fibrillation with rapid ventricular response (HCC)   Constipation   Chronic atrial fibrillation (HCC)   Impaction of the bowels [facility](HCC)   Discharge Condition: Stable and improved  Diet recommendation: Heart healthy  Filed Weights   03/28/17 0410 03/29/17 0622 03/30/17 0320  Weight: 54.1 kg (119 lb 4.8 oz) 54 kg (119 lb 1.6 oz) 55.1 kg (121 lb 8 oz)    History of present illness:  Per Dr Monico HoarMerrell Tanya Little is a very pleasant very HOH 83 y.o. female with medical history significant for atrial fibrillation on Xarelto, essential tremor for which she recently had insertion of brain stimulator that has not been activated, chronic UTI, hyponatremia, esophageal stricture presents to the emergency Department chief complaint sudden onset shortness of breath. Initial evaluation in the emergency department reveals atrial fibrillation with rapid ventricular response.  Information was obtained from the patient and the daughter who is at the bedside. Patient was in her usual state of health until around 2 AM she awakened suddenly with shortness of breath. At the time she denied  chest pain palpitations cough fever chills lower extremity edema. She did state that her shortness of breath was worse when lying flat. She denied headache dizziness syncope or near-syncope. Reported compliance with her medications but did indicate that recently her meds were adjusted due to "low blood pressure". She also complained of some chronic constipation. She denied dysuria hematuria frequency or urgency but is finishing up a course of Keflex for urinary tract infection. While in the emergency department she developed chest pain that was resolved on admission. She stated the pain was located under her left breast describes it as a "ache or pressure". She denied any radiation of this discomfort. She also complained of soreness behind her left ear chest area where brain stimulator recently inserted.    ED Course: In the emergency department she's afebrile hemodynamically stable with a heart rate up in the 130s at times. She was provided with 5 mg of IV metoprolol given her morning propanolol. At the time of admission rhythm remains atrial fibrillation but the rate is 83. Patient was chest pain-free. She was nontoxic appearing  Hospital Course:  #1 Atrial fibrillation with RVR CHA2DS2VASC ?? Etiology. Patient being treated for UTI. CXR negative. EKG with no ischemic changes. Patient was given some IV metoprolol in the ED and placed back on a home regimen of propranolol with improvement in heart rate. Patient noted to have a heart rates in the 120s to the 150s the morning of 03/29/2017, while on the commode and straining. Heart rate improved into the 80s when patient went back in bed. Cardiac enzymes negative 2. 2-D echo with EF = 55%, with no  WMA. Patient already given a dose of propranolol ER 60 mg daily. Patient given propranolol immediate release 10 mg every 4 hours 2 doses and increased home regimen to propranolol ER 80 mg daily. Patient ambulated in the hallway and heart rate went as high as  102. Patient's heart rate improved on increased dose of propranolol. Patient's home dose Xarelto was decreased to 15 mg daily based on creatinine clearance. Outpatient follow-up.   #2 recurrent recent UTI Patient finished a course of antibiotics for this hospitalization.   #3 severe protein calorie malnutrition Nutritional supplementation.  #4 gastroesophageal reflux disease Patient maintained on a PPI.  #5 acute dyspnea Likely secondary to problem #1. Clinical improvement with rate management. Chest x-ray negative for any acute abnormalities.  #6 hyponatremia Patient hydrated with IV fluids with improvement with hyponatremia. Outpatient follow-up.  #7 essential tremor Status post recent brain stimulator placement. Outpatient follow-up.  #8 constipation/Impaction Patient noted to be impacted with constipation and family had stated patient had not had a bowel movement in over a week. Patient was placed on MiraLAX and subsequently placed on enemas. Patient received manual disimpaction as well as multiple enemas with good results. Patient will be discharged home on a bowel regimen of MiraLAX twice daily as well as Senokot twice daily. Outpatient follow-up.      Procedures:  2 echo 03/28/2017  CXR 03/27/2017  Consultations:  None  Discharge Exam: Vitals:   03/30/17 0320 03/30/17 1401  BP: 115/82 (!) 127/59  Pulse: 68 93  Resp: 18 18  Temp: 97.8 F (36.6 C) 98.1 F (36.7 C)    General: NAD Cardiovascular: RRR Respiratory: CTAB  Discharge Instructions   Discharge Instructions    Diet - low sodium heart healthy    Complete by:  As directed    Increase activity slowly    Complete by:  As directed      Current Discharge Medication List    CONTINUE these medications which have CHANGED   Details  polyethylene glycol (MIRALAX / GLYCOLAX) packet Take 17 g by mouth 2 (two) times daily. Qty: 14 each, Refills: 0    propranolol ER (INDERAL LA) 80 MG 24 hr  capsule Take 1 capsule (80 mg total) by mouth daily. Qty: 30 capsule, Refills: 1    Rivaroxaban (XARELTO) 15 MG TABS tablet Take 1 tablet (15 mg total) by mouth daily with breakfast. Qty: 30 tablet, Refills: 1    senna (SENOKOT) 8.6 MG TABS tablet Take 1 tablet (8.6 mg total) by mouth 2 (two) times daily. Qty: 120 each, Refills: 0      CONTINUE these medications which have NOT CHANGED   Details  bisacodyl (DULCOLAX) 5 MG EC tablet Take 1 tablet (5 mg total) by mouth daily as needed for moderate constipation. Qty: 30 tablet, Refills: 0    calcium carbonate (TUMS - DOSED IN MG ELEMENTAL CALCIUM) 500 MG chewable tablet Chew 1 tablet (200 mg of elemental calcium total) by mouth 3 (three) times daily as needed for indigestion or heartburn.    feeding supplement, ENSURE ENLIVE, (ENSURE ENLIVE) LIQD Take 237 mLs by mouth 2 (two) times daily between meals. Qty: 237 mL, Refills: 12    Multiple Vitamins-Minerals (CENTRUM SILVER PO) Take 1 tablet by mouth daily.    NEXIUM 40 MG capsule Take 1 capsule (40 mg total) by mouth daily before breakfast. Qty: 90 capsule, Refills: 3   Associated Diagnoses: Esophageal stricture    potassium chloride (K-DUR,KLOR-CON) 10 MEQ tablet Take 10 mEq by mouth  daily.    traZODone (DESYREL) 150 MG tablet Take 150 mg by mouth at bedtime.      STOP taking these medications     cephALEXin (KEFLEX) 500 MG capsule        No Known Allergies Follow-up Information    Kaleen Mask, MD. Schedule an appointment as soon as possible for a visit in 2 week(s).   Specialty:  Family Medicine Contact information: [address] [place] [address] [phone]            The results of significant diagnostics from this hospitalization (including imaging, microbiology, ancillary and laboratory) are listed below for reference.    Significant Diagnostic Studies: Dg Chest 2 View  Result Date: 03/27/2017 CLINICAL DATA:  Acute onset of shortness  of breath. Initial encounter. EXAM: CHEST  2 VIEW COMPARISON:  Chest radiograph performed 12/28/2016 FINDINGS: The lungs are well-aerated and clear. There is no evidence of focal opacification, pleural effusion or pneumothorax. The heart is normal in size; the mediastinal contour is within normal limits. A metallic device is noted at the left chest wall, with a lead extending into the neck. No acute osseous abnormalities are seen. Chronic right-sided rib deformities are noted. IMPRESSION: No acute cardiopulmonary process seen. Electronically Signed   By: Roanna Raider M.D.   On: 03/27/2017 05:03    Microbiology: No results found for this or any previous visit (from the past 240 hour(s)).   Labs: Basic Metabolic Panel:  Recent Labs Lab 03/27/17 0351 03/28/17 0222 03/29/17 0236 03/30/17 0803  NA 130* 133* 129* 131*  K 4.1 3.9 4.0 4.1  CL 97* 99* 98* 102  CO2 23 24 24 23   GLUCOSE 112* 94 104* 112*  BUN 9 13 13 10   CREATININE 0.82 0.80 0.81 0.72  CALCIUM 9.1 8.8* 8.8* 8.5*  MG  --   --   --  1.7   Liver Function Tests: No results for input(s): AST, ALT, ALKPHOS, BILITOT, PROT, ALBUMIN in the last 168 hours. No results for input(s): LIPASE, AMYLASE in the last 168 hours. No results for input(s): AMMONIA in the last 168 hours. CBC:  Recent Labs Lab 03/27/17 0351 03/28/17 0222 03/29/17 0236 03/30/17 0803  WBC 13.3* 7.8 7.6 10.6*  NEUTROABS  --   --  4.7 8.4*  HGB 12.1 11.0* 10.6* 10.4*  HCT 35.4* 33.6* 32.8* 31.4*  MCV 89.6 90.3 90.6 90.2  PLT 302 310 272 244   Cardiac Enzymes:  Recent Labs Lab 03/27/17 0841 03/28/17 1446 03/28/17 2119  TROPONINI 0.06* <0.03 <0.03   BNP: BNP (last 3 results)  Recent Labs  03/27/17 0351  BNP 142.0*    ProBNP (last 3 results) No results for input(s): PROBNP in the last 8760 hours.  CBG: No results for input(s): GLUCAP in the last 168 hours.     SignedRamiro Harvest MD.  [facility] 03/30/2017, 3:44 PM

## 2017-03-30 NOTE — Progress Notes (Signed)
Pt walked 150 ft. On room air tolerated well heart rate stayed below 102

## 2017-03-30 NOTE — Progress Notes (Signed)
Discharged to home with family office visits in place teaching done  

## 2017-03-30 NOTE — Progress Notes (Signed)
Physical Therapy Treatment Patient Details Name: Tanya Little MRN: [id number] DOB: Jun 16, 1932 Today's Date: 03/30/2017    History of Present Illness Pt is a 83 yo female presenting to the ED for shortness of breath. Upon arrival pt was in atrial fibrillation with no complaints of chest pain, fever, cough, or LE edema. Pt has a medical history significant for essentail tremor with brain stimulator which has not yet been activated, and chronic UTI.     PT Comments    Patient progressing well towards PT goals. Improved ambulation distance with Min guard for balance/safety. Pt still needs cues for RW management/safety at times. Managed pericare and washing hands at sink without difficulty. Encouraged increasing mobility. HR stable in 90s-100s during session. Will continue to follow and progress as tolerated.    Follow Up Recommendations  Home health PT;Supervision/Assistance - 24 hour     Equipment Recommendations  Rolling walker with 5" wheels    Recommendations for Other Services       Precautions / Restrictions Precautions Precautions: Fall Restrictions Weight Bearing Restrictions: No    Mobility  Bed Mobility Overal bed mobility: Needs Assistance Bed Mobility: Supine to Sit     Supine to sit: Supervision;HOB elevated     General bed mobility comments: incr time but able to complete without physical assist; use of rail.  Transfers Overall transfer level: Needs assistance Equipment used: Rolling walker (2 wheeled) Transfers: Sit to/from Stand Sit to Stand: Min guard         General transfer comment: Min guard for safety. Stood from [employer] x2, from toilet x1.   Ambulation/Gait Ambulation/Gait assistance: Min guard Ambulation Distance (Feet): 100 Feet (+15') Assistive device: Rolling walker (2 wheeled) Gait Pattern/deviations: Step-through pattern;Decreased stride length;Trunk flexed Gait velocity: decreased Gait velocity interpretation: Below normal speed for  age/gender General Gait Details: Slow, mostly steady gait with cues for upright posture. Pt with head tremor.    Stairs            Wheelchair Mobility    Modified Rankin (Stroke Patients Only)       Balance Overall balance assessment: Needs assistance Sitting-balance support: No upper extremity supported;Feet supported Sitting balance-Leahy Scale: Good Sitting balance - Comments: Able to perform pericare without difficulty.    Standing balance support: Single extremity supported;During functional activity Standing balance-Leahy Scale: Fair Standing balance comment: Able to stand at sink and wash hands reaching outside bOS without difficulty or LOB.                            Cognition Arousal/Alertness: Awake/alert Behavior During Therapy: WFL for tasks assessed/performed Overall Cognitive Status: Within Functional Limits for tasks assessed                                 General Comments: HOH      Exercises      General Comments General comments (skin integrity, edema, etc.): pt's daughter present.      Pertinent Vitals/Pain Pain Assessment: No/denies pain    Home Living                      Prior Function            PT Goals (current goals can now be found in the care plan section) Progress towards PT goals: Progressing toward goals    Frequency    Min 3X/week  PT Plan Current plan remains appropriate    Co-evaluation              AM-PAC PT "6 Clicks" Daily Activity  Outcome Measure  Difficulty turning over in bed (including adjusting bedclothes, sheets and blankets)?: None Difficulty moving from lying on back to sitting on the side of the bed? : None Difficulty sitting down on and standing up from a chair with arms (e.g., wheelchair, bedside commode, etc,.)?: None Help needed moving to and from a bed to chair (including a wheelchair)?: A Little Help needed walking in hospital room?: A  Little Help needed climbing 3-5 steps with a railing? : A Lot 6 Click Score: 20    End of Session Equipment Utilized During Treatment: Gait belt Activity Tolerance: Patient tolerated treatment well Patient left: in bed;with family/visitor present;with call bell/phone within reach Nurse Communication: Mobility status PT Visit Diagnosis: Unsteadiness on feet (R26.81);Other abnormalities of gait and mobility (R26.89);Difficulty in walking, not elsewhere classified (R26.2)     Time: 1610-9604 PT Time Calculation (min) (ACUTE ONLY): 20 min  Charges:  $Gait Training: 8-22 mins                    G Codes:       Mylo Red, PT, DPT [phone]     Tanya Little 03/30/2017, 12:24 PM

## 2017-04-05 NOTE — Progress Notes (Signed)
PT evaluation addendum Late entry for 03/27/17 g-codes    03/27/17 1000  PT Time Calculation  PT Start Time (ACUTE ONLY) 1003  PT Stop Time (ACUTE ONLY) 1019  PT Time Calculation (min) (ACUTE ONLY) 16 min  PT G-Codes **NOT FOR INPATIENT CLASS**  Functional Assessment Tool Used AM-PAC 6 Clicks Basic Mobility  Functional Limitation Mobility: Walking and moving around  Mobility: Walking and Moving Around Current Status (Z6109(G8978) CJ  Mobility: Walking and Moving Around Goal Status (U0454(G8979) CI  PT General Charges  $$ ACUTE PT VISIT 1 Procedure  PT Evaluation  $PT Eval Moderate Complexity 1 Procedure  04/05/2017 Corlis HoveMargie Angala Hilgers, PT [phone]

## 2017-07-19 ENCOUNTER — Emergency Department (HOSPITAL_COMMUNITY): Payer: Medicare Other

## 2017-07-19 ENCOUNTER — Encounter (HOSPITAL_COMMUNITY): Payer: Self-pay | Admitting: Emergency Medicine

## 2017-07-19 ENCOUNTER — Observation Stay (HOSPITAL_COMMUNITY): Payer: Medicare Other

## 2017-07-19 ENCOUNTER — Observation Stay (HOSPITAL_COMMUNITY)
Admission: EM | Admit: 2017-07-19 | Discharge: 2017-07-22 | Disposition: A | Discharge status: Home / Self Care | Payer: Medicare Other | Attending: Internal Medicine | Admitting: Internal Medicine

## 2017-07-19 DIAGNOSIS — Z66 Do not resuscitate: Secondary | ICD-10-CM | POA: Diagnosis not present

## 2017-07-19 DIAGNOSIS — Z79899 Other long term (current) drug therapy: Secondary | ICD-10-CM | POA: Diagnosis not present

## 2017-07-19 DIAGNOSIS — K222 Esophageal obstruction: Secondary | ICD-10-CM | POA: Insufficient documentation

## 2017-07-19 DIAGNOSIS — E871 Hypo-osmolality and hyponatremia: Secondary | ICD-10-CM | POA: Diagnosis not present

## 2017-07-19 DIAGNOSIS — G250 Essential tremor: Secondary | ICD-10-CM | POA: Diagnosis not present

## 2017-07-19 DIAGNOSIS — N390 Urinary tract infection, site not specified: Secondary | ICD-10-CM | POA: Diagnosis not present

## 2017-07-19 DIAGNOSIS — K219 Gastro-esophageal reflux disease without esophagitis: Secondary | ICD-10-CM | POA: Diagnosis not present

## 2017-07-19 DIAGNOSIS — E860 Dehydration: Secondary | ICD-10-CM | POA: Insufficient documentation

## 2017-07-19 DIAGNOSIS — B965 Pseudomonas (aeruginosa) (mallei) (pseudomallei) as the cause of diseases classified elsewhere: Secondary | ICD-10-CM | POA: Diagnosis not present

## 2017-07-19 DIAGNOSIS — R531 Weakness: Secondary | ICD-10-CM | POA: Diagnosis not present

## 2017-07-19 DIAGNOSIS — R1314 Dysphagia, pharyngoesophageal phase: Secondary | ICD-10-CM | POA: Diagnosis present

## 2017-07-19 DIAGNOSIS — K449 Diaphragmatic hernia without obstruction or gangrene: Secondary | ICD-10-CM | POA: Diagnosis not present

## 2017-07-19 DIAGNOSIS — F0390 Unspecified dementia without behavioral disturbance: Secondary | ICD-10-CM | POA: Insufficient documentation

## 2017-07-19 DIAGNOSIS — R627 Adult failure to thrive: Secondary | ICD-10-CM | POA: Diagnosis not present

## 2017-07-19 DIAGNOSIS — I10 Essential (primary) hypertension: Secondary | ICD-10-CM | POA: Diagnosis not present

## 2017-07-19 DIAGNOSIS — Z7901 Long term (current) use of anticoagulants: Secondary | ICD-10-CM | POA: Insufficient documentation

## 2017-07-19 DIAGNOSIS — E43 Unspecified severe protein-calorie malnutrition: Secondary | ICD-10-CM | POA: Insufficient documentation

## 2017-07-19 DIAGNOSIS — I482 Chronic atrial fibrillation, unspecified: Secondary | ICD-10-CM | POA: Diagnosis present

## 2017-07-19 DIAGNOSIS — Z87891 Personal history of nicotine dependence: Secondary | ICD-10-CM | POA: Diagnosis not present

## 2017-07-19 DIAGNOSIS — D649 Anemia, unspecified: Secondary | ICD-10-CM | POA: Insufficient documentation

## 2017-07-19 HISTORY — DX: Essential (primary) hypertension: I10

## 2017-07-19 LAB — COMPREHENSIVE METABOLIC PANEL
ALK PHOS: 49 U/L (ref 38–126)
ALT: 13 U/L — ABNORMAL LOW (ref 14–54)
AST: 17 U/L (ref 15–41)
Albumin: 3.6 g/dL (ref 3.5–5.0)
Anion gap: 10 (ref 5–15)
BILIRUBIN TOTAL: 0.8 mg/dL (ref 0.3–1.2)
BUN: 13 mg/dL (ref 6–20)
CO2: 24 mmol/L (ref 22–32)
Calcium: 9.1 mg/dL (ref 8.9–10.3)
Chloride: 98 mmol/L — ABNORMAL LOW (ref 101–111)
Creatinine, Ser: 0.91 mg/dL (ref 0.44–1.00)
GFR calc Af Amer: >60 mL/min (ref >60)
GFR, EST NON AFRICAN AMERICAN: 57 mL/min — AB (ref >60)
Glucose, Bld: 109 mg/dL — ABNORMAL HIGH (ref 65–99)
POTASSIUM: 3.8 mmol/L (ref 3.5–5.1)
Sodium: 132 mmol/L — ABNORMAL LOW (ref 135–145)
Total Protein: 6.6 g/dL (ref 6.5–8.1)

## 2017-07-19 LAB — CBC WITH DIFFERENTIAL/PLATELET
BASOS ABS: 0 10*3/uL (ref 0.0–0.1)
Basophils Relative: 0 %
Eosinophils Absolute: 0.3 10*3/uL (ref 0.0–0.7)
Eosinophils Relative: 3 %
HCT: 34.4 % — ABNORMAL LOW (ref 36.0–46.0)
HEMOGLOBIN: 11.7 g/dL — AB (ref 12.0–15.0)
LYMPHS ABS: 1.4 10*3/uL (ref 0.7–4.0)
LYMPHS PCT: 14 %
MCH: 30.3 pg (ref 26.0–34.0)
MCHC: 34 g/dL (ref 30.0–36.0)
MCV: 89.1 fL (ref 78.0–100.0)
Monocytes Absolute: 0.5 10*3/uL (ref 0.1–1.0)
Monocytes Relative: 5 %
Neutro Abs: 7.7 10*3/uL (ref 1.7–7.7)
Neutrophils Relative %: 78 %
Platelets: 232 10*3/uL (ref 150–400)
RBC: 3.86 MIL/uL — AB (ref 3.87–5.11)
RDW: 12.7 % (ref 11.5–15.5)
WBC: 9.8 10*3/uL (ref 4.0–10.5)

## 2017-07-19 LAB — URINALYSIS, ROUTINE W REFLEX MICROSCOPIC
Bilirubin Urine: NEGATIVE
Glucose, UA: NEGATIVE mg/dL
Hgb urine dipstick: NEGATIVE
Ketones, ur: NEGATIVE mg/dL
Nitrite: POSITIVE — AB
PROTEIN: NEGATIVE mg/dL
SPECIFIC GRAVITY, URINE: 1.008 (ref 1.005–1.030)
pH: 7 (ref 5.0–8.0)

## 2017-07-19 LAB — TROPONIN I: Troponin I: <0.03 ng/mL (ref <0.03)

## 2017-07-19 MED ORDER — DEXTROSE 5 % IV SOLN
1.0000 g | INTRAVENOUS | Status: DC
  Administered 2017-07-19: 1 g via INTRAVENOUS
  Filled 2017-07-19 (×2): qty 10

## 2017-07-19 MED ORDER — HYDROCODONE-ACETAMINOPHEN 5-325 MG PO TABS: 1.0000 | ORAL_TABLET | ORAL | Status: DC | PRN

## 2017-07-19 MED ORDER — ZOLPIDEM TARTRATE 5 MG PO TABS
5.0000 mg | ORAL_TABLET | Freq: Once | ORAL | Status: AC
  Administered 2017-07-19: 5 mg via ORAL
  Filled 2017-07-19: qty 1

## 2017-07-19 MED ORDER — RIVAROXABAN 15 MG PO TABS
15.0000 mg | ORAL_TABLET | Freq: Every day | ORAL | Status: DC
  Administered 2017-07-19 – 2017-07-22 (×4): 15 mg via ORAL
  Filled 2017-07-19 (×4): qty 1

## 2017-07-19 MED ORDER — ACETAMINOPHEN 325 MG PO TABS
650.0000 mg | ORAL_TABLET | Freq: Four times a day (QID) | ORAL | Status: DC | PRN
  Administered 2017-07-21: 650 mg via ORAL
  Filled 2017-07-19: qty 2

## 2017-07-19 MED ORDER — ENSURE ENLIVE PO LIQD: 237.0000 mL | Freq: Every day | ORAL | Status: DC | PRN

## 2017-07-19 MED ORDER — POLYETHYLENE GLYCOL 3350 17 G PO PACK
17.0000 g | PACK | Freq: Two times a day (BID) | ORAL | Status: DC
  Administered 2017-07-19 – 2017-07-22 (×5): 17 g via ORAL
  Filled 2017-07-19 (×7): qty 1

## 2017-07-19 MED ORDER — PANTOPRAZOLE SODIUM 40 MG PO TBEC: 40.0000 mg | DELAYED_RELEASE_TABLET | Freq: Every day | ORAL | Status: DC

## 2017-07-19 MED ORDER — PROPRANOLOL HCL ER 80 MG PO CP24
80.0000 mg | ORAL_CAPSULE | Freq: Every day | ORAL | Status: DC
  Administered 2017-07-19 – 2017-07-22 (×4): 80 mg via ORAL
  Filled 2017-07-19 (×4): qty 1

## 2017-07-19 MED ORDER — SODIUM CHLORIDE 0.9 % IV SOLN
INTRAVENOUS | Status: AC
  Administered 2017-07-19 (×2): via INTRAVENOUS

## 2017-07-19 MED ORDER — POTASSIUM CHLORIDE CRYS ER 10 MEQ PO TBCR
10.0000 meq | EXTENDED_RELEASE_TABLET | Freq: Every day | ORAL | Status: DC
  Administered 2017-07-19 – 2017-07-22 (×4): 10 meq via ORAL
  Filled 2017-07-19 (×4): qty 1

## 2017-07-19 MED ORDER — ONDANSETRON HCL 4 MG/2ML IJ SOLN
4.0000 mg | Freq: Four times a day (QID) | INTRAMUSCULAR | Status: DC | PRN
  Administered 2017-07-22: 4 mg via INTRAVENOUS
  Filled 2017-07-19: qty 2

## 2017-07-19 MED ORDER — TRAZODONE HCL 150 MG PO TABS
150.0000 mg | ORAL_TABLET | Freq: Every day | ORAL | Status: DC
  Administered 2017-07-19 – 2017-07-21 (×3): 150 mg via ORAL
  Filled 2017-07-19 (×3): qty 1

## 2017-07-19 MED ORDER — SODIUM CHLORIDE 0.9 % IV SOLN: INTRAVENOUS | Status: DC

## 2017-07-19 MED ORDER — ACETAMINOPHEN 650 MG RE SUPP: 650.0000 mg | Freq: Four times a day (QID) | RECTAL | Status: DC | PRN

## 2017-07-19 MED ORDER — ONDANSETRON HCL 4 MG PO TABS: 4.0000 mg | ORAL_TABLET | Freq: Four times a day (QID) | ORAL | Status: DC | PRN

## 2017-07-19 MED ORDER — CALCIUM CARBONATE ANTACID 500 MG PO CHEW: 1.0000 | CHEWABLE_TABLET | Freq: Three times a day (TID) | ORAL | Status: DC | PRN

## 2017-07-19 NOTE — ED Provider Notes (Signed)
[facility]-EMERGENCY DEPT Provider Note   CSN: [id number] Arrival date & time: 07/19/17  9562     History   Chief Complaint Chief Complaint  Patient presents with  . Weakness    HPI Tanya Little is a 83 y.o. female. Level V caveat due to dementia and mental status changes. HPI Patient presents with generalized weakness. Worsens morning. Has not felt well for couple days though. Has caregivers at home but such is not quite herself. 2 days ago got started on Macrobid for a UTI. Cultures have reportedly not returned yet. Has had 7 recent UTIs. Does have some back pain and confusion which tends to be her symptoms of the UTI. Brought in from EMS because she was having some chest tightness. Does have some baseline confusion per family member with her. Patient does complain of slight chest tightness. Past Medical History:  Diagnosis Date  . Atrial fibrillation (HCC)   . Barrett's esophagus   . Esophageal stricture   . Essential tremor   . GERD (gastroesophageal reflux disease)   . Hiatal hernia     Patient Active Problem List   Diagnosis Date Noted  . Impaction of the bowels (HCC) 03/30/2017  . Constipation 03/29/2017  . Chronic atrial fibrillation (HCC)   . Protein-calorie malnutrition, severe 03/28/2017  . Atrial fibrillation with rapid ventricular response (HCC) 03/28/2017  . Acute dyspnea 03/27/2017  . Chest pain at rest 03/27/2017  . Recurrent UTI 03/27/2017  . Acute encephalopathy 12/28/2016  . Hyponatremia 12/28/2016  . Atrial fibrillation with RVR (HCC) 12/28/2016  . Acute lower UTI 12/28/2016  . Left-sided weakness 12/28/2016  . Esophageal stricture 09/06/2014  . Dysphagia, pharyngoesophageal phase 08/26/2014  . Encounter for colorectal cancer screening 08/26/2014  . Essential tremor 08/26/2014    Past Surgical History:  Procedure Laterality Date  . ABDOMINAL HYSTERECTOMY    . BALLOON DILATION N/A 09/06/2014   Procedure: BALLOON DILATION;  Surgeon: Louis Meckel,  MD;  Location: [facility];  Service: Endoscopy;  Laterality: N/A;  . DEEP BRAIN STIMULATOR PLACEMENT  02/13/2017   [facility]  . ESOPHAGOGASTRODUODENOSCOPY N/A 09/06/2014   Procedure: ESOPHAGOGASTRODUODENOSCOPY (EGD);  Surgeon: Louis Meckel, MD;  Location: [facility];  Service: Endoscopy;  Laterality: N/A;  . ESOPHAGOSCOPY WITH DILITATION    . KNEE ARTHROSCOPY Right   . ROTATOR CUFF REPAIR Right     OB History    No data available       Home Medications    Prior to Admission medications   Medication Sig Start Date End Date Taking? Authorizing Provider  bisacodyl (DULCOLAX) 5 MG EC tablet Take 1 tablet (5 mg total) by mouth daily as needed for moderate constipation. 12/31/16   Elgergawy, Leana Roe, MD  calcium carbonate (TUMS - DOSED IN MG ELEMENTAL CALCIUM) 500 MG chewable tablet Chew 1 tablet (200 mg of elemental calcium total) by mouth 3 (three) times daily as needed for indigestion or heartburn. 12/31/16   Elgergawy, Leana Roe, MD  feeding supplement, ENSURE ENLIVE, (ENSURE ENLIVE) LIQD Take 237 mLs by mouth 2 (two) times daily between meals. Patient taking differently: Take 237 mLs by mouth daily as needed (meal replacement).  12/31/16   Elgergawy, Leana Roe, MD  Multiple Vitamins-Minerals (CENTRUM SILVER PO) Take 1 tablet by mouth daily.    [provider]  NEXIUM 40 MG capsule Take 1 capsule (40 mg total) by mouth daily before breakfast. Patient taking differently: Take 40 mg by mouth every other day.  12/05/16   Leone Payor,  Maryjean Morn, MD  polyethylene glycol (MIRALAX / GLYCOLAX) packet Take 17 g by mouth 2 (two) times daily. 03/30/17   Rodolph Bong, MD  potassium chloride (K-DUR,KLOR-CON) 10 MEQ tablet Take 10 mEq by mouth daily.    [provider]  propranolol ER (INDERAL LA) 80 MG 24 hr capsule Take 1 capsule (80 mg total) by mouth daily. 03/31/17   Rodolph Bong, MD  Rivaroxaban (XARELTO) 15 MG TABS tablet Take 1 tablet (15 mg total) by mouth daily  with breakfast. 03/31/17   Rodolph Bong, MD  senna (SENOKOT) 8.6 MG TABS tablet Take 1 tablet (8.6 mg total) by mouth 2 (two) times daily. 03/30/17   Rodolph Bong, MD  traZODone (DESYREL) 150 MG tablet Take 150 mg by mouth at bedtime.    [provider]    Family History Family History  Problem Relation Age of Onset  . Diabetes Sister   . Lung cancer Brother   . Cancer Other        nephew  . Aneurysm Brother        x 3    Social History Social History  Substance Use Topics  . Smoking status: Former Smoker    Types: Cigarettes    Quit date: 08/26/2004  . Smokeless tobacco: Never Used  . Alcohol use No     Allergies   Sulfa antibiotics   Review of Systems Review of Systems  Unable to perform ROS: Mental status change  Constitutional: Positive for appetite change.  Respiratory: Positive for chest tightness.   Psychiatric/Behavioral: Positive for confusion.     Physical Exam Updated Vital Signs BP 109/69   Pulse 89   Temp 97.7 F (36.5 C) (Oral)   Resp 12   Ht  (1.778 m)   Wt 59 kg (130 lb)   SpO2 99%   BMI 18.65 kg/m   Physical Exam  Constitutional: She appears well-developed.  HENT:  Head: Normocephalic.  Neck: Neck supple.  Cardiovascular: Normal rate.   Irregular rhythm  Pulmonary/Chest: Effort normal. She has rales.  Few scattered rales.  Abdominal: There is no tenderness.  Musculoskeletal: She exhibits no edema.  Neurological: She is alert.  Patient is awake and pleasant. Slight confusion that is more than baseline per family  Skin: Skin is warm. Capillary refill takes less than 2 seconds.     ED Treatments / Results  Labs (all labs ordered are listed, but only abnormal results are displayed) Labs Reviewed  COMPREHENSIVE METABOLIC PANEL - Abnormal; Notable for the following:       Result Value   Sodium 132 (*)    Chloride 98 (*)    Glucose, Bld 109 (*)    ALT 13 (*)    GFR calc non Af Amer 57 (*)    All other  components within normal limits  URINALYSIS, ROUTINE W REFLEX MICROSCOPIC - Abnormal; Notable for the following:    APPearance HAZY (*)    Nitrite POSITIVE (*)    Leukocytes, UA LARGE (*)    Bacteria, UA RARE (*)    Squamous Epithelial / LPF 6-30 (*)    Non Squamous Epithelial 0-5 (*)    All other components within normal limits  CBC WITH DIFFERENTIAL/PLATELET - Abnormal; Notable for the following:    RBC 3.86 (*)    Hemoglobin 11.7 (*)    HCT 34.4 (*)    All other components within normal limits  URINE CULTURE  TROPONIN I    EKG  EKG Interpretation  Date/Time:  Friday July 19 2017 07:33:34 EDT Ventricular Rate:  86 PR Interval:    QRS Duration: 91 QT Interval:  394 QTC Calculation: 472 R Axis:   -47 Text Interpretation:  Atrial fibrillation Borderline ST elevation, anterior leads Artifact in lead(s) I II aVR aVL aVF V1 V2 V3 V4 V5 V6 Confirmed by Benjiman Core [phone]) on 07/19/2017 7:45:35 AM       Radiology Dg Chest 2 View  Result Date: 07/19/2017 CLINICAL DATA:  Weakness. EXAM: CHEST  2 VIEW COMPARISON:  Radiographs of March 27, 2017. FINDINGS: The heart size and mediastinal contours are within normal limits. Both lungs are clear. Atherosclerosis of thoracic aorta is noted. No pneumothorax or pleural effusion is noted. Old right rib fractures are noted. IMPRESSION: No active cardiopulmonary disease.  Aortic atherosclerosis. Electronically Signed   By: Lupita Raider, M.D.   On: 07/19/2017 08:18    Procedures Procedures (including critical care time)  Medications Ordered in ED Medications - No data to display   Initial Impression / Assessment and Plan / ED Course  I have reviewed the triage vital signs and the nursing notes.  Pertinent labs & imaging results that were available during my care of the patient were reviewed by me and considered in my medical decision making (see chart for details).     Patient generalized weakness and some increasing  confusion. Reportedly treated for UTI with Macrobid for the last couple days. Urine shows possible infection here labwork otherwise reassuring. Has had back pain which is typical for her UTIs. Awake and pleasant. Has had recent other infections. With worsening symptoms and on antibiotics she may benefit from admission for further evaluation treatment.  Final Clinical Impressions(s) / ED Diagnoses   Final diagnoses:  Weakness  Urinary tract infection without hematuria, site unspecified    New Prescriptions New Prescriptions   No medications on file     Benjiman Core, MD 07/19/17 [phone]

## 2017-07-19 NOTE — Progress Notes (Signed)
Pharmacy Antibiotic Note  Tanya Little is a 83 y.o. female admitted on 07/19/2017 with altered mental status likely due to a UTI.  Pharmacy has been consulted for ceftriaxone dosing. WBC wnl. Afebrile   Plan: -Ceftriaxone 1 gm IV Q 24 hours -Monitor CBC and cultures  -Pharmacy to sign off as no further dosage adjustments needed   Height:  (177.8 cm) Weight: 130 lb (59 kg) IBW/kg (Calculated) : 68.5  Temp (24hrs), Avg:97.7 F (36.5 C), Min:97.7 F (36.5 C), Max:97.7 F (36.5 C)   Recent Labs Lab 07/19/17 0800  WBC 9.8  CREATININE 0.91    Estimated Creatinine Clearance: 43.6 mL/min (by C-G formula based on SCr of 0.91 mg/dL).    Allergies  Allergen Reactions  . Sulfa Antibiotics     Antimicrobials this admission: Ceftriaxone 10/5 >>   Dose adjustments this admission: None   Microbiology results: 10/5 UCx:     Thank you for allowing pharmacy to be a part of this patient's care.  Vinnie Level, PharmD., BCPS Clinical Pharmacist Pager [phone]

## 2017-07-19 NOTE — Progress Notes (Signed)
Patient arrived to floor from ED. Alert and oriented x4.  

## 2017-07-19 NOTE — ED Notes (Signed)
Pure Wick set up on Pt.

## 2017-07-19 NOTE — Care Management Note (Signed)
Case Management Note  Patient Details  Name: DEVINE DANT MRN: [id number] Date of Birth: [birth year]  Subjective/Objective:                    Action/Plan:  Consult for [facility] will need order for [facility] and face to face  Await PT/OT evals Expected Discharge Date:                  Expected Discharge Plan:  Home w Home Health Services  In-House Referral:     Discharge planning Services  CM Consult  Post Acute Care Choice:  Durable Medical Equipment, Home Health Choice offered to:     DME Arranged:    DME Agency:     HH Arranged:    HH Agency:     Status of Service:  In process, will continue to follow  If discussed at Long Length of Stay Meetings, dates discussed:    Additional Comments:  Kingsley Plan, RN 07/19/2017, 3:54 PM

## 2017-07-19 NOTE — ED Notes (Signed)
Report given to 6N

## 2017-07-19 NOTE — ED Notes (Signed)
Pt taken to US

## 2017-07-19 NOTE — H&P (Signed)
History and Physical    Tanya Little:[id number] DOB: 04/28/1932 DOA: 07/19/2017   PCP: Kaleen Mask, MD   Patient coming from:  Home    Chief Complaint: Generalized weakness   HPI: Tanya Little is a 83 y.o. female with medical history significant for chronic atrial fibrillation on Xarelto, history of esophageal stricture status post multiple dilations, hiatal hernia, hypertension, essential tremor, and a history of or current UTIs, presenting today with generalized weakness over the last week, worse over the last 2 days. History is obtained by daughter, as the patient has dementia. She reports her mother was "not quite herself ". 2 days ago, the patient was started on Macrobid for her last UTI (7 in total since March of this year). The daughter reports that due to dementia, her mother has not been cleaning her genital area well after voiding, which may have contributed to her symptomsSymptoms are accompanied by some suprapubic tenderness, and lower back pain. No apparent upper abdominal pain nausea or vomiting. No constipation or diarrhea. No worsening confusion. No fever, or chills. No shortness of breath or cough. No apparent chest pain or palpitations. No falls, syncope or presyncope. No sick contacts. No apparent headache, or vision changes. .  ED Course:  BP 109/69   Pulse 89   Temp 97.7 F (36.5 C) (Oral)   Resp 12   Ht  (1.778 m)   Wt 59 kg (130 lb)   SpO2 99%   BMI 18.65 kg/m    Sodium 132 potassium 3.8  glucose 109  creatinine 0.91  calcium 9.1  troponin negative less than 0.03  white count 9.8  hemoglobin 11.7  platelets 232  chest x-ray with no acute findings  Urine positive for nitrites, and large leukocytes.  Review of Systems:  As per HPI otherwise all other systems reviewed and are negative  Past Medical History:  Diagnosis Date  . Atrial fibrillation (HCC)   . Barrett's esophagus   . Esophageal stricture   . Essential tremor   . GERD  (gastroesophageal reflux disease)   . Hiatal hernia   . Hypertension     Past Surgical History:  Procedure Laterality Date  . ABDOMINAL HYSTERECTOMY    . BALLOON DILATION N/A 09/06/2014   Procedure: BALLOON DILATION;  Surgeon: Louis Meckel, MD;  Location: [facility];  Service: Endoscopy;  Laterality: N/A;  . DEEP BRAIN STIMULATOR PLACEMENT  02/13/2017   [facility]  . ESOPHAGOGASTRODUODENOSCOPY N/A 09/06/2014   Procedure: ESOPHAGOGASTRODUODENOSCOPY (EGD);  Surgeon: Louis Meckel, MD;  Location: [facility];  Service: Endoscopy;  Laterality: N/A;  . ESOPHAGOSCOPY WITH DILITATION    . KNEE ARTHROSCOPY Right   . ROTATOR CUFF REPAIR Right     Social History Social History   Social History  . Marital status: Divorced    Spouse name: N/A  . Number of children: 4  . Years of education: N/A   Occupational History  . retired    Social History Main Topics  . Smoking status: Former Smoker    Types: Cigarettes    Quit date: 08/26/2004  . Smokeless tobacco: Never Used  . Alcohol use No  . Drug use: No  . Sexual activity: No   Other Topics Concern  . Not on file   Social History Narrative  . No narrative on file     Allergies  Allergen Reactions  . Sulfa Antibiotics     Family History  Problem Relation Age of Onset  .  Diabetes Sister   . Lung cancer Brother   . Cancer Other        nephew  . Aneurysm Brother        x 3      Prior to Admission medications   Medication Sig Start Date End Date Taking? Authorizing Provider  bisacodyl (DULCOLAX) 5 MG EC tablet Take 1 tablet (5 mg total) by mouth daily as needed for moderate constipation. 12/31/16   Elgergawy, Leana Roe, MD  calcium carbonate (TUMS - DOSED IN MG ELEMENTAL CALCIUM) 500 MG chewable tablet Chew 1 tablet (200 mg of elemental calcium total) by mouth 3 (three) times daily as needed for indigestion or heartburn. 12/31/16   Elgergawy, Leana Roe, MD  feeding supplement, ENSURE ENLIVE, (ENSURE ENLIVE)  LIQD Take 237 mLs by mouth 2 (two) times daily between meals. Patient taking differently: Take 237 mLs by mouth daily as needed (meal replacement).  12/31/16   Elgergawy, Leana Roe, MD  Multiple Vitamins-Minerals (CENTRUM SILVER PO) Take 1 tablet by mouth daily.    [provider]  NEXIUM 40 MG capsule Take 1 capsule (40 mg total) by mouth daily before breakfast. Patient taking differently: Take 40 mg by mouth every other day.  12/05/16   Iva Boop, MD  polyethylene glycol [facility] Ethelene Hal) packet Take 17 g by mouth 2 (two) times daily. 03/30/17   Rodolph Bong, MD  potassium chloride (K-DUR,KLOR-CON) 10 MEQ tablet Take 10 mEq by mouth daily.    [provider]  propranolol ER (INDERAL LA) 80 MG 24 hr capsule Take 1 capsule (80 mg total) by mouth daily. 03/31/17   Rodolph Bong, MD  Rivaroxaban (XARELTO) 15 MG TABS tablet Take 1 tablet (15 mg total) by mouth daily with breakfast. 03/31/17   Rodolph Bong, MD  senna (SENOKOT) 8.6 MG TABS tablet Take 1 tablet (8.6 mg total) by mouth 2 (two) times daily. 03/30/17   Rodolph Bong, MD  traZODone (DESYREL) 150 MG tablet Take 150 mg by mouth at bedtime.    [provider]    Physical Exam:  Vitals:   07/19/17 0730 07/19/17 0745 07/19/17 0800 07/19/17 0815  BP: 118/74 109/72 126/89 109/69  Pulse: 70 69 (!) 103 89  Resp: Temp:      TempSrc:      SpO2: 98% 97% 99% 99%  Weight:      Height:       Constitutional: NAD, calm, comfortable  Eyes: PERRL, lids and conjunctivae normal ENMT: Mucous membranes are dryt, without exudate or lesions  Neck: normal, supple, no masses, no thyromegaly Respiratory: clear to auscultation bilaterally, no wheezing, no crackles. Normal respiratory effort  Cardiovascular: IRR  and rhythm,  murmur, rubs or gallops. No extremity edema. 2+ pedal pulses. No carotid bruits.  Abdomen: Soft, suprapubic tenderness , No hepatosplenomegaly. Bowel sounds positive.  Lower flank tenderness bilaterally Musculoskeletal: no clubbing / cyanosis. Moves all extremities Skin: no jaundice, No lesions.  Neurologic: Sensation intact  Strength equal in all extremities. Essential tremor R, less on L.  L sided neurostimulator non tender  Psychiatric:   Known dementia, but able to answer simple questions and follow simple commands  Normal mood.     Labs on Admission: I have personally reviewed following labs and imaging studies  CBC:  Recent Labs Lab 07/19/17 0800  WBC 9.8  NEUTROABS 7.7  HGB 11.7*  HCT 34.4*  MCV 89.1  PLT 232    Basic  Metabolic Panel:  Recent Labs Lab 07/19/17 0800  NA 132*  K 3.8  CL 98*  CO2 24  GLUCOSE 109*  BUN 13  CREATININE 0.91  CALCIUM 9.1    GFR: Estimated Creatinine Clearance: 43.6 mL/min (by C-G formula based on SCr of 0.91 mg/dL).  Liver Function Tests:  Recent Labs Lab 07/19/17 0800  AST 17  ALT 13*  ALKPHOS 49  BILITOT 0.8  PROT 6.6  ALBUMIN 3.6   No results for input(s): LIPASE, AMYLASE in the last 168 hours. No results for input(s): AMMONIA in the last 168 hours.  Coagulation Profile: No results for input(s): INR, PROTIME in the last 168 hours.  Cardiac Enzymes:  Recent Labs Lab 07/19/17 0800  TROPONINI <0.03    BNP (last 3 results) No results for input(s): PROBNP in the last 8760 hours.  HbA1C: No results for input(s): HGBA1C in the last 72 hours.  CBG: No results for input(s): GLUCAP in the last 168 hours.  Lipid Profile: No results for input(s): CHOL, HDL, LDLCALC, TRIG, CHOLHDL, LDLDIRECT in the last 72 hours.  Thyroid Function Tests: No results for input(s): TSH, T4TOTAL, FREET4, T3FREE, THYROIDAB in the last 72 hours.  Anemia Panel: No results for input(s): VITAMINB12, FOLATE, FERRITIN, TIBC, IRON, RETICCTPCT in the last 72 hours.  Urine analysis:    Component Value Date/Time   COLORURINE YELLOW 07/19/2017 0907   APPEARANCEUR HAZY (A) 07/19/2017 0907   LABSPEC  1.008 07/19/2017 0907   PHURINE 7.0 07/19/2017 0907   GLUCOSEU NEGATIVE 07/19/2017 0907   HGBUR NEGATIVE 07/19/2017 0907   BILIRUBINUR NEGATIVE 07/19/2017 0907   KETONESUR NEGATIVE 07/19/2017 0907   PROTEINUR NEGATIVE 07/19/2017 0907   NITRITE POSITIVE (A) 07/19/2017 0907   LEUKOCYTESUR LARGE (A) 07/19/2017 0907    Sepsis Labs: (procalcitonin:4,lacticidven:4) )No results found for this or any previous visit (from the past 240 hour(s)).   Radiological Exams on Admission: Dg Chest 2 View  Result Date: 07/19/2017 CLINICAL DATA:  Weakness. EXAM: CHEST  2 VIEW COMPARISON:  Radiographs of March 27, 2017. FINDINGS: The heart size and mediastinal contours are within normal limits. Both lungs are clear. Atherosclerosis of thoracic aorta is noted. No pneumothorax or pleural effusion is noted. Old right rib fractures are noted. IMPRESSION: No active cardiopulmonary disease.  Aortic atherosclerosis. Electronically Signed   By: Lupita Raider, M.D.   On: 07/19/2017 08:18    EKG: Independently reviewed.  Assessment/Plan Active Problems:   Recurrent UTI   Protein-calorie malnutrition, severe   Dysphagia, pharyngoesophageal phase   Essential tremor   Esophageal stricture   Hyponatremia   Chronic atrial fibrillation (HCC)   GERD (gastroesophageal reflux disease)   Generalized weakness     Generalized weakness in the setting of recurrent UTI suggested by UA + nitrites and leukocytes in urine. This is to set this UTI since March. Last one was diagnosed 2 days ago, beginning Macrobid by mouth as an outpatient. WBC is 9.8 . Afebrile   Admit to Medsurg   F/u urine culture Ceftriaxone IV  Follow CBC in am  IVF at 100 cc/h PT/OT Renal ultrasound r/o stones/obstruction Care Management for [facility]  Nutrition consult   Atrial Fibrillation   on anticoagulation with Xarelto. No acute issues. Tn neg. Last 2 D Echo JUne 2018  EF 55%, Normal systolic Continue Xarelto and Propanolol EKG in  am   Hypertension BP 109/69   Pulse 89    Controlled Continue home propanolol.   GERD, history of esophageal stricture, no acute  symptoms Continue PPI Soft diet   Essential tremor, s/p brain stimulator inserted at [facility], activated. Stable at baseline  Hyponatremia, history of same, essentially at baseline, in the setting of  poor oral intake Current Na 132 . No worsening confusion IVF 100 cc/h CMET in am     DVT prophylaxis:  Xarelto Code Status:    DNR Family Communication:  Discussed with patient's daughter Disposition Plan: Expect patient to be discharged to home after condition improves Consults called:    Care manager, dietitian, PT Admission status: Med surg obs    Marcos Eke, PA-C [facility]   07/19/2017, 10:16 AM

## 2017-07-19 NOTE — ED Triage Notes (Signed)
Patient presents to the ED from home with complaints of generalized  weakness this morning. Patient reports she woke up and was just not feeling good. Patient reports she was Dx with UTI on Tuesday and was placed on antibiotic ( Macobid)  for it. Patient Neuro intact. Patient denies any pain arrival. Patient and oriented x4.

## 2017-07-19 NOTE — ED Notes (Signed)
Patient made aware urine needed reports she will try a little later. RN advised if she can not go we can cath patient. Patient family member also given ginger ale.

## 2017-07-19 NOTE — ED Notes (Signed)
Attempted to call daughter to let know patient is moved.

## 2017-07-20 DIAGNOSIS — N390 Urinary tract infection, site not specified: Secondary | ICD-10-CM | POA: Diagnosis not present

## 2017-07-20 DIAGNOSIS — E43 Unspecified severe protein-calorie malnutrition: Secondary | ICD-10-CM | POA: Diagnosis not present

## 2017-07-20 DIAGNOSIS — R531 Weakness: Secondary | ICD-10-CM

## 2017-07-20 DIAGNOSIS — E871 Hypo-osmolality and hyponatremia: Secondary | ICD-10-CM | POA: Diagnosis not present

## 2017-07-20 DIAGNOSIS — G250 Essential tremor: Secondary | ICD-10-CM

## 2017-07-20 DIAGNOSIS — K219 Gastro-esophageal reflux disease without esophagitis: Secondary | ICD-10-CM

## 2017-07-20 DIAGNOSIS — I482 Chronic atrial fibrillation: Secondary | ICD-10-CM | POA: Diagnosis not present

## 2017-07-20 LAB — CBC
HCT: 32.3 % — ABNORMAL LOW (ref 36.0–46.0)
HEMOGLOBIN: 10.9 g/dL — AB (ref 12.0–15.0)
MCH: 30.3 pg (ref 26.0–34.0)
MCHC: 33.7 g/dL (ref 30.0–36.0)
MCV: 89.7 fL (ref 78.0–100.0)
PLATELETS: 224 10*3/uL (ref 150–400)
RBC: 3.6 MIL/uL — ABNORMAL LOW (ref 3.87–5.11)
RDW: 12.5 % (ref 11.5–15.5)
WBC: 7.1 10*3/uL (ref 4.0–10.5)

## 2017-07-20 LAB — COMPREHENSIVE METABOLIC PANEL
ALBUMIN: 2.9 g/dL — AB (ref 3.5–5.0)
ALK PHOS: 40 U/L (ref 38–126)
ALT: 11 U/L — ABNORMAL LOW (ref 14–54)
ANION GAP: 9 (ref 5–15)
AST: 19 U/L (ref 15–41)
BILIRUBIN TOTAL: 0.8 mg/dL (ref 0.3–1.2)
BUN: 12 mg/dL (ref 6–20)
CALCIUM: 8.1 mg/dL — AB (ref 8.9–10.3)
CO2: 21 mmol/L — ABNORMAL LOW (ref 22–32)
Chloride: 100 mmol/L — ABNORMAL LOW (ref 101–111)
Creatinine, Ser: 0.77 mg/dL (ref 0.44–1.00)
GFR calc Af Amer: >60 mL/min (ref >60)
GFR calc non Af Amer: >60 mL/min (ref >60)
GLUCOSE: 107 mg/dL — AB (ref 65–99)
Potassium: 4.2 mmol/L (ref 3.5–5.1)
Sodium: 130 mmol/L — ABNORMAL LOW (ref 135–145)
TOTAL PROTEIN: 5.6 g/dL — AB (ref 6.5–8.1)

## 2017-07-20 MED ORDER — SENNA 8.6 MG PO TABS
1.0000 | ORAL_TABLET | Freq: Two times a day (BID) | ORAL | Status: DC
  Administered 2017-07-21 – 2017-07-22 (×4): 8.6 mg via ORAL
  Filled 2017-07-20 (×4): qty 1

## 2017-07-20 MED ORDER — FAMOTIDINE 20 MG PO TABS
40.0000 mg | ORAL_TABLET | Freq: Every day | ORAL | Status: DC
  Administered 2017-07-20 – 2017-07-22 (×3): 40 mg via ORAL
  Filled 2017-07-20 (×3): qty 2

## 2017-07-20 MED ORDER — DEXTROSE 5 % IV SOLN
1.0000 g | Freq: Two times a day (BID) | INTRAVENOUS | Status: DC
  Administered 2017-07-20 – 2017-07-22 (×5): 1 g via INTRAVENOUS
  Filled 2017-07-20 (×6): qty 1

## 2017-07-20 MED ORDER — DONEPEZIL HCL 10 MG PO TABS
10.0000 mg | ORAL_TABLET | Freq: Every day | ORAL | Status: DC
  Administered 2017-07-21 (×2): 10 mg via ORAL
  Filled 2017-07-20 (×2): qty 1

## 2017-07-20 MED ORDER — ENSURE ENLIVE PO LIQD
237.0000 mL | Freq: Two times a day (BID) | ORAL | Status: DC
  Administered 2017-07-20 – 2017-07-22 (×2): 237 mL via ORAL

## 2017-07-20 MED ORDER — SODIUM CHLORIDE 0.9 % IV SOLN
INTRAVENOUS | Status: DC
  Administered 2017-07-20: 18:00:00 via INTRAVENOUS

## 2017-07-20 NOTE — Progress Notes (Signed)
Unable to obtain ordered 12-lead EKG due to "powerline interference" and artifact from deep brain stimulator. Notified Schorr via text page. Will inform day shift.

## 2017-07-20 NOTE — Evaluation (Signed)
Occupational Therapy Evaluation Patient Details Name: Tanya Little MRN: [id number] DOB: 12-Sep-1932 Today's Date: 07/20/2017    History of Present Illness 83 y.o. female with medical history significant for chronic atrial fibrillation on Xarelto, history of esophageal stricture status post multiple dilations, hiatal hernia, hypertension, essential tremor, and a h/o or current UTIs, presenting with generalized weakness over the last week   Clinical Impression   Pt admitted with the above diagnoses and presents with below problem list. Pt will benefit from continued acute OT to address the below listed deficits and maximize independence with basic ADLs prior to d/c home. PTA pt was mod I with ADLs (uses cane). Pt is currently setup to min guard with ADLs. No family present during eval. Will follow acutely.       Follow Up Recommendations  No OT follow up;Supervision - Intermittent    Equipment Recommendations  None recommended by OT    Recommendations for Other Services PT consult     Precautions / Restrictions Precautions Precautions: Fall Restrictions Weight Bearing Restrictions: No      Mobility Bed Mobility Overal bed mobility: Modified Independent                Transfers Overall transfer level: Needs assistance Equipment used: 1 person hand held assist Transfers: Sit to/from Stand Sit to Stand: Min guard         General transfer comment: from EOB and comfort height toilet with grab bars    Balance Overall balance assessment: Needs assistance         Standing balance support: Single extremity supported;During functional activity Standing balance-Leahy Scale: Fair Standing balance comment: cane at baseline                           ADL either performed or assessed with clinical judgement   ADL Overall ADL's : Needs assistance/impaired Eating/Feeding: Set up;Sitting   Grooming: Standing;Supervision/safety   Upper Body Bathing: Set  up;Sitting   Lower Body Bathing: Min guard;Sit to/from stand   Upper Body Dressing : Set up;Sitting   Lower Body Dressing: Min guard;Sit to/from stand   Toilet Transfer: Min guard;Ambulation   Toileting- Clothing Manipulation and Hygiene: Min guard;Sit to/from stand   Tub/ Shower Transfer: Tub transfer;Min guard;Ambulation;Shower seat;Grab bars   Functional mobility during ADLs: Min guard (+1 HHA in lieu of cane) General ADL Comments: Pt compelted in-room functional mobility, toilet transfer, simulated tub transfer as detailed above. Discussed life alert-type devices. No family present.      Vision         Perception     Praxis      Pertinent Vitals/Pain Pain Assessment: No/denies pain     Hand Dominance     Extremity/Trunk Assessment Upper Extremity Assessment Upper Extremity Assessment: Generalized weakness;Overall [facility] for tasks assessed   Lower Extremity Assessment Lower Extremity Assessment: Defer to PT evaluation       Communication Communication Communication: No difficulties   Cognition Arousal/Alertness: Awake/alert Behavior During Therapy: WFL for tasks assessed/performed Overall Cognitive Status: History of cognitive impairments - at baseline                                     General Comments       Exercises     Shoulder Instructions      Home Living Family/patient expects to be discharged to:: Private residence Living Arrangements:  Alone Available Help at Discharge: Family;Available PRN/intermittently Type of Home: Mobile home Home Access: Stairs to enter Entrance Stairs-Number of Steps: 2 Entrance Stairs-Rails: Can reach both Home Layout: One level     Bathroom Shower/Tub: Chief Strategy Officer: Standard     Home Equipment: Cane - single point;Grab bars - tub/shower;Shower seat          Prior Functioning/Environment Level of Independence: Independent with assistive device(s)        Comments: Pt  initially reporting she occasionally uses cane but once in standing stated that she always uses cane at home.        OT Problem List: Impaired balance (sitting and/or standing);Decreased activity tolerance;Decreased knowledge of use of DME or AE;Decreased knowledge of precautions      OT Treatment/Interventions: Self-care/ADL training;DME and/or AE instruction;Therapeutic activities;Patient/family education;Balance training    OT Goals(Current goals can be found in the care plan section) Acute Rehab OT Goals Patient Stated Goal: not stated OT Goal Formulation: With patient Time For Goal Achievement: 07/27/17 Potential to Achieve Goals: Good ADL Goals Pt Will Perform Grooming: with modified independence;standing (2-3 tasks) Pt Will Perform Tub/Shower Transfer: Tub transfer;with modified independence;ambulating;shower seat (cane)  OT Frequency: Min 2X/week   Barriers to D/C:    from home alone. 2 sons live nearby       Co-evaluation              AM-PAC PT "6 Clicks" Daily Activity     Outcome Measure Help from another person eating meals?: None Help from another person taking care of personal grooming?: None Help from another person toileting, which includes using toliet, bedpan, or urinal?: A Little Help from another person bathing (including washing, rinsing, drying)?: A Little Help from another person to put on and taking off regular upper body clothing?: None Help from another person to put on and taking off regular lower body clothing?: None 6 Click Score: 22   End of Session    Activity Tolerance: Patient tolerated treatment well Patient left: in bed;with call bell/phone within reach;with bed alarm set  OT Visit Diagnosis: Unsteadiness on feet (R26.81);Muscle weakness (generalized) (M62.81)                Time: 6045-4098 OT Time Calculation (min): 14 min Charges:  OT General Charges $OT Visit: 1 Visit OT Evaluation $OT Eval Low Complexity: 1 Low G-Codes: OT  G-codes **NOT FOR INPATIENT CLASS** Functional Assessment Tool Used: AM-PAC 6 Clicks Daily Activity Functional Limitation: Self care Self Care Current Status (J1914): At least 20 percent but less than 40 percent impaired, limited or restricted Self Care Goal Status (N8295): At least 1 percent but less than 20 percent impaired, limited or restricted    Pilar Grammes 07/20/2017, 1:11 PM

## 2017-07-20 NOTE — Progress Notes (Signed)
PROGRESS NOTE   Tanya Little  UEA:[id number]    DOB: [birth year]    DOA: 07/19/2017  PCP: Kaleen Mask, MD   I have briefly reviewed patients previous medical records in [facility]  Brief Narrative:  83 year old female, lives alone, family lives nearby, PMH of reportedly advanced dementia, chronic atrial fibrillation on Xarelto, esophageal stricture status post multiple dilatations but consumes normal diet, hiatal hernia, HTN, essential tremor, recurrent UTIs (apparently this is her eighth UTI since March this year), presented to ED with progressively worsening generalized weakness, not being herself lately, low back pain and suprapubic pain. Admitted for possible recurrent UTI.   Assessment & Plan:   Active Problems:   Dysphagia, pharyngoesophageal phase   Essential tremor   Esophageal stricture   Hyponatremia   Recurrent UTI   Protein-calorie malnutrition, severe   Chronic atrial fibrillation (HCC)   GERD (gastroesophageal reflux disease)   Generalized weakness   1. Acute lower UTI, Pseudomonas: Likely complicated by poor GU hygiene from her dementia. Family reports multiple UTI since March 2018. Empirically started on ceftriaxone which was changed to IV [place]. Follow final cultures. Normal renal ultrasound. 2. Dehydration: Reportedly does not drink much fluids. IV fluids for additional 24 hours. Encourage oral intake. 3. Generalized weakness/adult failure to thrive/dementia: As per daughter/healthcare power of attorney at bedside, patient unable to take care of herself and family unable to assist and hence may need SNF for discharge. Requested PT and OT evaluation. 4. Chronic atrial fibrillation: Continue propranolol ER and Xarelto. Stable. 5. Essential hypertension: Controlled. 6. Essential tremor: Probably on propranolol for this and her hypertension. Status post brain stimulator inserted at [facility]. 7. GERD/esophageal stricture status post dilatation/hiatal hernia:  As per family, patient tolerates regular diet at home without dysphagia or aspiration noted. They indicated that patient will refuse to eat pured diet. Regular consistency diet as tolerated. 8. Hyponatremia: Some of this may be due to dehydration. Stable. Follow BMP. 9. Anemia: Follow CBCs.   DVT prophylaxis: Xarelto Code Status: DO NOT RESUSCITATE Family Communication: Discussed in detail with patient's daughter/healthcare power of attorney and neighbor at bedside. Disposition: PT and OT recommend no follow-up and intermittent supervision. However family indicated that patient lives alone and that she is unable to take care of herself. Need to discuss with case management/social worker.   Consultants:  None   Procedures:  None  Antimicrobials:  IV Rocephin/discontinued IV Fortaz 10/6 >    Subjective: Seen this morning. States that she feels much better. Unable to clarify what is better. Denies complaints. As per daughter at bedside, she has improved with improved hydration but reports chronic poor fluid intake, recurrent UTIs which she attributes to poor GU hygiene. Patient's neighbor at bedside who keeps an eye on the patient corroborates this history.   ROS: As above.  Objective:  Vitals:   07/19/17 2053 07/20/17 0446 07/20/17 0500 07/20/17 1500  BP: (!) 106/55 109/61  110/62  Pulse: 67 64  65  Resp: Temp: 97.6 F (36.4 C) 97.7 F (36.5 C)  98 F (36.7 C)  TempSrc: Oral Oral  Oral  SpO2: 97% 100%  100%  Weight:   57.8 kg (127 lb 8 oz)   Height:        Examination:  General exam: Pleasant elderly female, moderately built and frail, lying comfortably propped up in bed. Oral mucosa borderline hydration. Respiratory system: Clear to auscultation. Respiratory effort normal. Cardiovascular system: S1 & S2 heard, RRR.  No JVD, murmurs, rubs, gallops or clicks. No pedal edema. Gastrointestinal system: Abdomen is nondistended, soft and nontender. No organomegaly  or masses felt. Normal bowel sounds heard. Central nervous system: Alert and oriented to person and place but not to time. No focal neurological deficits. Extremities: Symmetric 5 x 5 power. Skin: No rashes, lesions or ulcers Psychiatry: Judgement and insight impaired. Mood & affect appropriate.     Data Reviewed: I have personally reviewed following labs and imaging studies  CBC:  Recent Labs Lab 07/19/17 0800 07/20/17 0505  WBC 9.8 7.1  NEUTROABS 7.7  --   HGB 11.7* 10.9*  HCT 34.4* 32.3*  MCV 89.1 89.7  PLT 232 224   Basic Metabolic Panel:  Recent Labs Lab 07/19/17 0800 07/20/17 0505  NA 132* 130*  K 3.8 4.2  CL 98* 100*  CO2 24 21*  GLUCOSE 109* 107*  BUN 13 12  CREATININE 0.91 0.77  CALCIUM 9.1 8.1*   Liver Function Tests:  Recent Labs Lab 07/19/17 0800 07/20/17 0505  AST 17 19  ALT 13* 11*  ALKPHOS 49 40  BILITOT 0.8 0.8  PROT 6.6 5.6*  ALBUMIN 3.6 2.9*   Coagulation Profile: No results for input(s): INR, PROTIME in the last 168 hours. Cardiac Enzymes:  Recent Labs Lab 07/19/17 0800  TROPONINI <0.03   HbA1C: No results for input(s): HGBA1C in the last 72 hours. CBG: No results for input(s): GLUCAP in the last 168 hours.  Recent Results (from the past 240 hour(s))  Urine culture     Status: Abnormal (Preliminary result)   Collection Time: 07/19/17 10:00 AM  Result Value Ref Range Status   Specimen Description URINE, CLEAN CATCH  Final   Special Requests NONE  Final   Culture >=100,000 COLONIES/mL PSEUDOMONAS AERUGINOSA (A)  Final   Report Status PENDING  Incomplete         Radiology Studies: Dg Chest 2 View  Result Date: 07/19/2017 CLINICAL DATA:  Weakness. EXAM: CHEST  2 VIEW COMPARISON:  Radiographs of March 27, 2017. FINDINGS: The heart size and mediastinal contours are within normal limits. Both lungs are clear. Atherosclerosis of thoracic aorta is noted. No pneumothorax or pleural effusion is noted. Old right rib fractures are  noted. IMPRESSION: No active cardiopulmonary disease.  Aortic atherosclerosis. Electronically Signed   By: Lupita Raider, M.D.   On: 07/19/2017 08:18   US Renal  Result Date: 07/19/2017 CLINICAL DATA:  Recurrent UTI EXAM: RENAL / URINARY TRACT ULTRASOUND COMPLETE COMPARISON:  None. FINDINGS: Right Kidney: Length: 9.7 cm. Echogenicity within normal limits. No mass or hydronephrosis visualized. Left Kidney: Length: 9.7 cm. Echogenicity within normal limits. No mass or hydronephrosis visualized. Bladder: Appears normal for degree of bladder distention. Other: Incidence note made of gallstones. IMPRESSION: Normal renal ultrasound. Electronically Signed   By: Elige Ko   On: 07/19/2017 11:38        Scheduled Meds: . feeding supplement (ENSURE ENLIVE)  237 mL Oral BID BM  . polyethylene glycol  17 g Oral BID  . potassium chloride  10 mEq Oral Daily  . propranolol ER  80 mg Oral Daily  . Rivaroxaban  15 mg Oral Q breakfast  . traZODone  150 mg Oral QHS   Continuous Infusions: . cefTAZidime (FORTAZ)  IV Stopped (07/20/17 1217)     LOS: 0 days     HONGALGI,ANAND, MD, FACP, FHM. [facility] Pager [phone]  If 7PM-7AM, please contact night-coverage [email or web address] Password [email or web address] 07/20/2017, 5:31 PM

## 2017-07-20 NOTE — Progress Notes (Signed)
Physical Therapy Evaluation Patient Details Name: Tanya Little MRN: [id number] DOB: 12/13/1931 Today's Date: 07/20/2017   History of Present Illness  83 y.o. female with medical history significant for chronic atrial fibrillation on Xarelto, history of esophageal stricture status post multiple dilations, hiatal hernia, hypertension, essential tremor, and a h/o or current UTIs, presenting with generalized weakness over the last week  Clinical Impression  Pt admitted with above diagnosis. Pt currently with functional limitations due to the deficits listed below (see PT Problem List).  Patient doing well this afternoon. Patient reports living at home alone with intermittent use of SPC, with family able to assist intermittently when needed. Patient today able to complete all bed mobility, with use of handrails and increased time with Mod I; ambulating and performing transfers with [facility] for general safety. Patient to benefit from continued PT services to maximize functional mobility prior to d/c.     Follow Up Recommendations No PT follow up;Supervision - Intermittent;Supervision for mobility/OOB    Equipment Recommendations  None recommended by PT    Recommendations for Other Services       Precautions / Restrictions Precautions Precautions: Fall Restrictions Weight Bearing Restrictions: No      Mobility  Bed Mobility Overal bed mobility: Modified Independent             General bed mobility comments: sidelying <> sit with ease - use of handrails  Transfers Overall transfer level: Needs assistance Equipment used: Rolling walker (2 wheeled) Transfers: Sit to/from Stand Sit to Stand: Min guard         General transfer comment: from EOB - slightly elevated  Ambulation/Gait Ambulation/Gait assistance: Min guard Ambulation Distance (Feet): 200 Feet Assistive device: Rolling walker (2 wheeled) Gait Pattern/deviations: Step-through pattern;Decreased stride length;Trunk  flexed Gait velocity: slow      Stairs            Wheelchair Mobility    Modified Rankin (Stroke Patients Only)       Balance Overall balance assessment: Needs assistance Sitting-balance support: No upper extremity supported Sitting balance-Leahy Scale: Good     Standing balance support: Bilateral upper extremity supported;During functional activity Standing balance-Leahy Scale: Fair Standing balance comment: use of cane at home                             Pertinent Vitals/Pain Pain Assessment: No/denies pain    Home Living Family/patient expects to be discharged to:: Private residence Living Arrangements: Alone Available Help at Discharge: Family;Available PRN/intermittently Type of Home: Mobile home Home Access: Stairs to enter Entrance Stairs-Rails: Can reach both Entrance Stairs-Number of Steps: 2 Home Layout: One level Home Equipment: Cane - single point;Grab bars - tub/shower;Shower seat      Prior Function Level of Independence: Independent with assistive device(s)         Comments: Pt initially reporting she occasionally uses cane but once in standing stated that she always uses cane at home.     Hand Dominance        Extremity/Trunk Assessment   Upper Extremity Assessment Upper Extremity Assessment: Defer to OT evaluation    Lower Extremity Assessment Lower Extremity Assessment: Generalized weakness (able to lift B LE against gravity)       Communication   Communication: No difficulties  Cognition Arousal/Alertness: Awake/alert Behavior During Therapy: WFL for tasks assessed/performed Overall Cognitive Status: History of cognitive impairments - at baseline  General Comments      Exercises     Assessment/Plan    PT Assessment Patient needs continued PT services  PT Problem List Decreased strength;Decreased activity tolerance;Decreased balance;Decreased  mobility;Decreased knowledge of use of DME       PT Treatment Interventions DME instruction;Gait training;Functional mobility training;Therapeutic activities;Therapeutic exercise;Balance training;Patient/family education    PT Goals (Current goals can be found in the Care Plan section)  Acute Rehab PT Goals Patient Stated Goal: to go home PT Goal Formulation: With patient    Frequency Min 3X/week   Barriers to discharge        Co-evaluation               AM-PAC PT "6 Clicks" Daily Activity  Outcome Measure Difficulty turning over in bed (including adjusting bedclothes, sheets and blankets)?: None Difficulty moving from lying on back to sitting on the side of the bed? : A Little Difficulty sitting down on and standing up from a chair with arms (e.g., wheelchair, bedside commode, etc,.)?: A Little Help needed moving to and from a bed to chair (including a wheelchair)?: A Little Help needed walking in hospital room?: A Little Help needed climbing 3-5 steps with a railing? : A Lot 6 Click Score: 18    End of Session Equipment Utilized During Treatment: Gait belt Activity Tolerance: Patient tolerated treatment well Patient left: in chair;with call bell/phone within reach;with bed alarm set Nurse Communication: Mobility status PT Visit Diagnosis: Unsteadiness on feet (R26.81);Other abnormalities of gait and mobility (R26.89);Muscle weakness (generalized) (M62.81)    Time: 4098-1191 PT Time Calculation (min) (ACUTE ONLY): 23 min   Charges:   PT Evaluation $PT Eval Moderate Complexity: 1 Mod     PT G Codes:   PT G-Codes **NOT FOR INPATIENT CLASS** Functional Assessment Tool Used: AM-PAC 6 Clicks Basic Mobility Functional Limitation: Mobility: Walking and moving around Mobility: Walking and Moving Around Current Status (Y7829): At least 40 percent but less than 60 percent impaired, limited or restricted Mobility: Walking and Moving Around Goal Status [phone]): At least 20  percent but less than 40 percent impaired, limited or restricted    Kipp Laurence, PT, DPT 07/20/17 2:52 PM

## 2017-07-20 NOTE — Progress Notes (Addendum)
Pharmacy Antibiotic Note  Tanya Little is a 83 y.o. female admitted on 07/19/2017 with altered mental status likely due to a UTI.  Pharmacy has been consulted to change from Rocephin to [place] d/t urine culture growing Pseudomonas (sensitivity pending).  Patient is afebrile and her WBC is WNL.  Renal function stable.  Family reports that this is patient's 8th UTI since March.  Patient also has baseline dementia,   Plan: Fortaz 1gm IV Q12H Monitor renal fxn, C&S, clinical progress   Height:  (177.8 cm) Weight: 127 lb 8 oz (57.8 kg) IBW/kg (Calculated) : 68.5  Temp (24hrs), Avg:97.7 F (36.5 C), Min:97.6 F (36.4 C), Max:97.7 F (36.5 C)   Recent Labs Lab 07/19/17 0800 07/20/17 0505  WBC 9.8 7.1  CREATININE 0.91 0.77    Estimated Creatinine Clearance: 48.6 mL/min (by C-G formula based on SCr of 0.77 mg/dL).    Allergies  Allergen Reactions  . Sulfa Antibiotics      CTX 10/5 >> 10/6 Elita Quick 10/6 >> Macrobid PTA  10/5 UCx - Pseudomonas   Shanikia Kernodle D. Laney Potash, PharmD, BCPS Pager:  [phone] 07/20/2017, 10:55 AM

## 2017-07-20 NOTE — Progress Notes (Addendum)
Initial Nutrition Assessment  DOCUMENTATION CODES:  Not applicable  INTERVENTION:  Verbal order to upgrade diet to regular consistencies  Continue Ensure Enlive po BID, each supplement provides 350 kcal and 20 grams of protein  NUTRITION DIAGNOSIS:  Inadequate oral intake related to Dementia vs recurrent acute illnesses vs poor appetite  as evidenced by loss of 8% bw x 7 months  GOAL:  Patient will meet greater than or equal to 90% of their needs  MONITOR:  PO intake, Supplement acceptance, Diet advancement, Labs, I & O's  REASON FOR ASSESSMENT:  Consult Poor PO  ASSESSMENT:  83 y/o female PMHx Dementia, Afib, Esophageal stricture s/p dilation, hiatal hernia, HTN, essential tremor, recurrent UTIs (7 this year). Presents w/ generalized weakness x 1 week, particular worse x1-2 days. Worked up for another UTI.   Patients daughter was at bedside. She provided majority of information. She is upset pt is on a dysphagia 1 diet. She does not know why she was placed on this. She apparently eats a regular diet at home. She does have a history of esophageal stricture and has dilations in the past, but the daughter says the last one was this past November and the pt "has had no issues since".   Per daughter, the patient eats "very well" at home. SHe says when they go out to eat, the patient eats more than she does. She takes a mvi at home and, due to wt loss, they have been attempting to get the patient to consume Ensure. Daughter states she prefers "thick beverages" and eats this best when mixed with ice cream-will modify ensure order to reflect this.   No recent n/v/d. She has a history of constipation. She takes miralax daily and dulcolax prn. Patient feels her bowels are well managed at this moment.   Daughter says that despite the patient eating well, she has lost weight in the past 6-7 months. She reportedly weighed 140 lbs at that time. Per chart review and "Care Everywhere", pt was  135-140 this past spring. She dropped to 121 lbs this Spring. However, She has started to regain weight. She now is only down 11 lbs since March (8%). This is not significant for the time frame. Her ht this admission is substantially higher than all prior measurements. BMI thought not accurate, not underweight  RD paged MD regarding diet advancement-received verbal order to upgrade  NFPE: Deferred due to patient sleeping in fetal position.   Labs: Albumin: 2.9, H/H:10.9/32.3, Na: 130 Meds: KCL, miralax, IVF, IVabx,    Recent Labs Lab 07/19/17 0800 07/20/17 0505  NA 132* 130*  K 3.8 4.2  CL 98* 100*  CO2 24 21*  BUN 13 12  CREATININE 0.91 0.77  CALCIUM 9.1 8.1*  GLUCOSE 109* 107*   Diet Order:  Diet regular Room service appropriate? Yes; Fluid consistency: Thin  Skin:  Reviewed, no issues  Last BM:  10/5  Heght:  Ht Readings from Last 1 Encounters:  07/19/17  (1.778 m)  This height is felt to be overestimated, past heights have been 5' 7.5"   Weight:  Wt Readings from Last 1 Encounters:  07/20/17 127 lb 8 oz (57.8 kg)   Wt Readings from Last 10 Encounters:  07/20/17 127 lb 8 oz (57.8 kg)  03/30/17 121 lb 8 oz (55.1 kg)  12/31/16 134 lb 0.6 oz (60.8 kg)  12/29/16 138 lb (62.6 kg)  12/05/16 137 lb (62.1 kg)  11/26/16 137 lb (62.1 kg)  09/06/14 136 lb (  61.7 kg)  08/27/14 138 lb (62.6 kg)  08/26/14 138 lb (62.6 kg)   Ideal Body Weight:  62.5 kg (Using ht of 5' 7.5")  BMI:  Body mass index is 18.29 kg/m.  Estimated Nutritional Needs:  Kcal:  1550-1750 kcals (27-30 kcal/kg bw) Protein:  70-80g Pro (1.2-1.4 g/kg bw) Fluid:  1.6-1.8 L fluid (1 ml/kcal)  EDUCATION NEEDS:  No education needs identified at this time  Christophe Louis RD, LDN, CNSC Clinical Nutrition Pager: [phone] 07/20/2017 10:10 AM

## 2017-07-20 NOTE — Discharge Instructions (Addendum)
Information on my medicine - XARELTO® (Rivaroxaban) ° °This medication education was reviewed with me or my healthcare representative as part of my discharge preparation.  The pharmacist that spoke with me during my hospital stay was:  Dang, Thuy Dien, RPH ° °Why was Xarelto® prescribed for you? °Xarelto® was prescribed for you to reduce the risk of a blood clot forming that can cause a stroke if you have a medical condition called atrial fibrillation (a type of irregular heartbeat). ° °What do you need to know about xarelto® ? °Take your Xarelto® ONCE DAILY at the same time every day with your evening meal. °If you have difficulty swallowing the tablet whole, you may crush it and mix in applesauce just prior to taking your dose. ° °Take Xarelto® exactly as prescribed by your doctor and DO NOT stop taking Xarelto® without talking to the doctor who prescribed the medication.  Stopping without other stroke prevention medication to take the place of Xarelto® may increase your risk of developing a clot that causes a stroke.  Refill your prescription before you run out. ° °After discharge, you should have regular check-up appointments with your healthcare provider that is prescribing your Xarelto®.  In the future your dose may need to be changed if your kidney function or weight changes by a significant amount. ° °What do you do if you miss a dose? °If you are taking Xarelto® ONCE DAILY and you miss a dose, take it as soon as you remember on the same day then continue your regularly scheduled once daily regimen the next day. Do not take two doses of Xarelto® at the same time or on the same day.  ° °Important Safety Information °A possible side effect of Xarelto® is bleeding. You should call your healthcare provider right away if you experience any of the following: °? Bleeding from an injury or your nose that does not stop. °? Unusual colored urine (red or dark brown) or unusual colored stools (red or black). °? Unusual  bruising for unknown reasons. °? A serious fall or if you hit your head (even if there is no bleeding). ° °Some medicines may interact with Xarelto® and might increase your risk of bleeding while on Xarelto®. To help avoid this, consult your healthcare provider or pharmacist prior to using any new prescription or non-prescription medications, including herbals, vitamins, non-steroidal anti-inflammatory drugs (NSAIDs) and supplements. ° °This website has more information on Xarelto®: www.xarelto.com. ° ° °Additional discharge instructions: ° °Please get your medications reviewed and adjusted by your Primary MD. ° °Please request your Primary MD to go over all Hospital Tests and Procedure/Radiological results at the follow up, please get all Hospital records sent to your Prim MD by signing hospital release before you go home. ° °If you had Pneumonia of Lung problems at the Hospital: °Please get a 2 view Chest X ray done in 6-8 weeks after hospital discharge or sooner if instructed by your Primary MD. ° °If you have Congestive Heart Failure: °Please call your Cardiologist or Primary MD anytime you have any of the following symptoms:  °1) 3 pound weight gain in 24 hours or 5 pounds in 1 week  °2) shortness of breath, with or without a dry hacking cough  °3) swelling in the hands, feet or stomach  °4) if you have to sleep on extra pillows at night in order to breathe ° °Follow cardiac low salt diet and 1.5 lit/day fluid restriction. ° °If you have diabetes °Accuchecks 4 times/day, Once   in AM empty stomach and then before each meal. °Log in all results and show them to your primary doctor at your next visit. °If any glucose reading is under 80 or above 300 call your primary MD immediately. ° °If you have Seizure/Convulsions/Epilepsy: °Please do not drive, operate heavy machinery, participate in activities at heights or participate in high speed sports until you have seen by Primary MD or a Neurologist and advised to do so  again. ° °If you had Gastrointestinal Bleeding: °Please ask your Primary MD to check a complete blood count within one week of discharge or at your next visit. Your endoscopic/colonoscopic biopsies that are pending at the time of discharge, will also need to followed by your Primary MD. ° °Get Medicines reviewed and adjusted. °Please take all your medications with you for your next visit with your Primary MD ° °Please request your Primary MD to go over all hospital tests and procedure/radiological results at the follow up, please ask your Primary MD to get all Hospital records sent to his/her office. ° °If you experience worsening of your admission symptoms, develop shortness of breath, life threatening emergency, suicidal or homicidal thoughts you must seek medical attention immediately by calling 911 or calling your MD immediately  if symptoms less severe. ° °You must read complete instructions/literature along with all the possible adverse reactions/side effects for all the Medicines you take and that have been prescribed to you. Take any new Medicines after you have completely understood and accpet all the possible adverse reactions/side effects.  ° °Do not drive or operate heavy machinery when taking Pain medications.  ° °Do not take more than prescribed Pain, Sleep and Anxiety Medications ° °Special Instructions: If you have smoked or chewed Tobacco  in the last 2 yrs please stop smoking, stop any regular Alcohol  and or any Recreational drug use. ° °Wear Seat belts while driving. ° °Please note °You were cared for by a hospitalist during your hospital stay. If you have any questions about your discharge medications or the care you received while you were in the hospital after you are discharged, you can call the unit and asked to speak with the hospitalist on call if the hospitalist that took care of you is not available. Once you are discharged, your primary care physician will handle any further medical  issues. Please note that NO REFILLS for any discharge medications will be authorized once you are discharged, as it is imperative that you return to your primary care physician (or establish a relationship with a primary care physician if you do not have one) for your aftercare needs so that they can reassess your need for medications and monitor your lab values. ° °You can reach the hospitalist office at phone [phone] or fax [phone] °  °If you do not have a primary care physician, you can call [phone] for a physician referral. ° ° ° °

## 2017-07-21 ENCOUNTER — Encounter (HOSPITAL_COMMUNITY): Payer: Self-pay

## 2017-07-21 DIAGNOSIS — N390 Urinary tract infection, site not specified: Secondary | ICD-10-CM | POA: Diagnosis not present

## 2017-07-21 LAB — BASIC METABOLIC PANEL
ANION GAP: 8 (ref 5–15)
BUN: 12 mg/dL (ref 6–20)
CALCIUM: 8.1 mg/dL — AB (ref 8.9–10.3)
CO2: 23 mmol/L (ref 22–32)
CREATININE: 0.84 mg/dL (ref 0.44–1.00)
Chloride: 100 mmol/L — ABNORMAL LOW (ref 101–111)
Glucose, Bld: 93 mg/dL (ref 65–99)
Potassium: 4.1 mmol/L (ref 3.5–5.1)
SODIUM: 131 mmol/L — AB (ref 135–145)

## 2017-07-21 LAB — CBC
HEMATOCRIT: 31.2 % — AB (ref 36.0–46.0)
Hemoglobin: 10.5 g/dL — ABNORMAL LOW (ref 12.0–15.0)
MCH: 30.3 pg (ref 26.0–34.0)
MCHC: 33.7 g/dL (ref 30.0–36.0)
MCV: 90.2 fL (ref 78.0–100.0)
PLATELETS: 201 10*3/uL (ref 150–400)
RBC: 3.46 MIL/uL — ABNORMAL LOW (ref 3.87–5.11)
RDW: 12.7 % (ref 11.5–15.5)
WBC: 6.4 10*3/uL (ref 4.0–10.5)

## 2017-07-21 LAB — URINE CULTURE: Culture: >=100000 — AB

## 2017-07-21 NOTE — Progress Notes (Signed)
PROGRESS NOTE   Tanya Little  ZOX:[id number]    DOB: 01-07-1932    DOA: 07/19/2017  PCP: Kaleen Mask, MD   I have briefly reviewed patients previous medical records in [facility].  Brief Narrative:  83 year old female, lives alone, family lives nearby, PMH of reportedly advanced dementia, chronic atrial fibrillation on Xarelto, esophageal stricture status post multiple dilatations but consumes normal diet, hiatal hernia, HTN, essential tremor, recurrent UTIs (apparently this is her eighth UTI since March this year), presented to ED with progressively worsening generalized weakness, not being herself lately, low back pain and suprapubic pain. Admitted for possible recurrent UTI.   Assessment & Plan:   Active Problems:   Dysphagia, pharyngoesophageal phase   Essential tremor   Esophageal stricture   Hyponatremia   Recurrent UTI   Protein-calorie malnutrition, severe   Chronic atrial fibrillation (HCC)   GERD (gastroesophageal reflux disease)   Generalized weakness   1. Acute lower UTI, Pseudomonas: Likely complicated by poor GU hygiene from her dementia. Family reports multiple UTI's since March 2018. Empirically started on ceftriaxone which was changed to IV Fortaz (sensitive). Normal renal ultrasound. Day 2 of 3 IV Fortaz and may consider completion of treatment at that time or consider a few more days of oral Cipro at discharge. 2. Dehydration: Reportedly does not drink much fluids. Resolved after IV fluids. Discontinue IV fluids. Encouraged oral intake. 3. Generalized weakness/adult failure to thrive/dementia: As per daughter/healthcare power of attorney at bedside, patient unable to take care of herself and family unable to assist. However PT and OT recommended no follow-up and hence may be difficult to place in SNF. Social Visual merchandiser. 4. Chronic atrial fibrillation: Continue propranolol ER and Xarelto. Stable. 5. Essential hypertension:  Controlled. 6. Essential tremor: Probably on propranolol for this and her hypertension. Status post brain stimulator inserted at [facility]. 7. GERD/esophageal stricture status post dilatation/hiatal hernia: As per family, patient tolerates regular diet at home without dysphagia or aspiration noted. They indicated that patient will refuse to eat pured diet. Regular consistency diet as tolerated. 8. Hyponatremia: Some of this may be due to dehydration. Stable.  9. Anemia: Stable   DVT prophylaxis: Xarelto Code Status: DO NOT RESUSCITATE Family Communication: Discussed in detail with patient's daughter/healthcare power of attorney in detail. Updated care and answered questions.. Disposition: To be determined. Social worker consultation to look for options i.e. ALF if possible.   Consultants:  None   Procedures:  None  Antimicrobials:  IV Rocephin/discontinued IV Fortaz 10/6 >    Subjective: Seen this morning. She stated that she had eaten 50% of her breakfast. Poor historian. As per RN, no acute issues reported.  ROS: As above.  Objective:  Vitals:   07/20/17 2035 07/21/17 0300 07/21/17 0500 07/21/17 1411  BP: (!) 99/59  102/67 105/70  Pulse: 70 77 77 75  Resp: Temp: 98.3 F (36.8 C) 97.7 F (36.5 C) 97.7 F (36.5 C) 98 F (36.7 C)  TempSrc: Oral Oral Oral Oral  SpO2: 100%   100%  Weight:      Height:        Examination:  General exam: Pleasant elderly female, moderately built and frail, sitting up comfortably in chair this morning. Oral mucosa moist.  Respiratory system: Clear to auscultation. Respiratory effort normal. Stable Cardiovascular system: S1 & S2 heard, RRR. No JVD, murmurs, rubs, gallops or clicks. No pedal edema. Stable Gastrointestinal system: Abdomen is nondistended, soft and nontender. No organomegaly or  masses felt. Normal bowel sounds heard. Stable Central nervous system: Alert and oriented to person and place but not to time. No focal  neurological deficits. Extremities: Symmetric 5 x 5 power. Skin: No rashes, lesions or ulcers Psychiatry: Judgement and insight impaired. Mood & affect appropriate.     Data Reviewed: I have personally reviewed following labs and imaging studies  CBC:  Recent Labs Lab 07/19/17 0800 07/20/17 0505 07/21/17 0404  WBC 9.8 7.1 6.4  NEUTROABS 7.7  --   --   HGB 11.7* 10.9* 10.5*  HCT 34.4* 32.3* 31.2*  MCV 89.1 89.7 90.2  PLT 232 224 201   Basic Metabolic Panel:  Recent Labs Lab 07/19/17 0800 07/20/17 0505 07/21/17 0404  NA 132* 130* 131*  K 3.8 4.2 4.1  CL 98* 100* 100*  CO2 24 21* 23  GLUCOSE 109* 107* 93  BUN CREATININE 0.91 0.77 0.84  CALCIUM 9.1 8.1* 8.1*   Liver Function Tests:  Recent Labs Lab 07/19/17 0800 07/20/17 0505  AST 17 19  ALT 13* 11*  ALKPHOS 49 40  BILITOT 0.8 0.8  PROT 6.6 5.6*  ALBUMIN 3.6 2.9*   Coagulation Profile: No results for input(s): INR, PROTIME in the last 168 hours. Cardiac Enzymes:  Recent Labs Lab 07/19/17 0800  TROPONINI <0.03   HbA1C: No results for input(s): HGBA1C in the last 72 hours. CBG: No results for input(s): GLUCAP in the last 168 hours.  Recent Results (from the past 240 hour(s))  Urine culture     Status: Abnormal   Collection Time: 07/19/17 10:00 AM  Result Value Ref Range Status   Specimen Description URINE, CLEAN CATCH  Final   Special Requests NONE  Final   Culture >=100,000 COLONIES/mL PSEUDOMONAS AERUGINOSA (A)  Final   Report Status 07/21/2017 FINAL  Final   Organism ID, Bacteria PSEUDOMONAS AERUGINOSA (A)  Final      Susceptibility   Pseudomonas aeruginosa - MIC*    CEFTAZIDIME 4 SENSITIVE Sensitive     CIPROFLOXACIN <=0.25 SENSITIVE Sensitive     GENTAMICIN 2 SENSITIVE Sensitive     IMIPENEM <=0.25 SENSITIVE Sensitive     PIP/TAZO 8 SENSITIVE Sensitive     CEFEPIME 4 SENSITIVE Sensitive     * >=100,000 COLONIES/mL PSEUDOMONAS AERUGINOSA         Radiology Studies: No  results found.      Scheduled Meds: . donepezil  10 mg Oral QHS  . famotidine  40 mg Oral Daily  . feeding supplement (ENSURE ENLIVE)  237 mL Oral BID BM  . polyethylene glycol  17 g Oral BID  . potassium chloride  10 mEq Oral Daily  . propranolol ER  80 mg Oral Daily  . Rivaroxaban  15 mg Oral Q breakfast  . senna  1 tablet Oral BID  . traZODone  150 mg Oral QHS   Continuous Infusions: . cefTAZidime (FORTAZ)  IV Stopped (07/21/17 1106)     LOS: 0 days     Karol Skarzynski, MD, FACP, FHM. [facility] Pager [phone]  If 7PM-7AM, please contact night-coverage [email or web address] Password [email or web address] 07/21/2017, 3:48 PM

## 2017-07-21 NOTE — Progress Notes (Signed)
Occupational Therapy Treatment Patient Details Name: Tanya Little MRN: [id number] DOB: 1932-04-12 Today's Date: 07/21/2017    History of present illness 83 y.o. female with medical history significant for chronic atrial fibrillation on Xarelto, history of esophageal stricture status post multiple dilations, hiatal hernia, hypertension, essential tremor, and a h/o or current UTIs, presenting with generalized weakness over the last week   OT comments  Pt. Able to complete bed mobility, toileting tasks, and standing grooming with min guard a.  Agree with initial recommendation for intermittent S once home to ensure safety during mobility and ADL completion.   Follow Up Recommendations  No OT follow up;Supervision - Intermittent    Equipment Recommendations  None recommended by OT    Recommendations for Other Services PT consult    Precautions / Restrictions Precautions Precautions: Fall       Mobility Bed Mobility Overal bed mobility: Needs Assistance             General bed mobility comments: sidelying <> sit with ease - use of handrails  Transfers Overall transfer level: Needs assistance Equipment used: Rolling walker (2 wheeled) Transfers: Sit to/from [employer] Sit to Stand: Min guard Stand pivot transfers: Min guard            Balance                                           ADL either performed or assessed with clinical judgement   ADL Overall ADL's : Needs assistance/impaired     Grooming: Wash/dry hands;Standing;Min guard               Lower Body Dressing: Min guard;Sit to/from stand Lower Body Dressing Details (indicate cue type and reason): stood to don/doff disposable underwear briefs Toilet Transfer: Educational psychologist Details (indicate cue type and reason): 3n1 over the commode Toileting- Clothing Manipulation and Hygiene: Min guard;Sit to/from stand       Functional  mobility during ADLs: Min guard General ADL Comments: continued with in room functional mobility.  pt. with more cues needed for rw management but aware she does not usually amb. with rw.  remains min guard a for safety.  no family present this am     Vision       Perception     Praxis      Cognition Arousal/Alertness: Awake/alert Behavior During Therapy: WFL for tasks assessed/performed Overall Cognitive Status: Within Functional Limits for tasks assessed                                          Exercises     Shoulder Instructions       General Comments      Pertinent Vitals/ Pain       Pain Assessment: No/denies pain  Home Living                                          Prior Functioning/Environment              Frequency  Min 2X/week        Progress Toward Goals  OT Goals(current goals can now be found in the  care plan section)  Progress towards OT goals: Progressing toward goals     Plan Discharge plan remains appropriate    Co-evaluation                 AM-PAC PT "6 Clicks" Daily Activity     Outcome Measure   Help from another person eating meals?: None Help from another person taking care of personal grooming?: None Help from another person toileting, which includes using toliet, bedpan, or urinal?: A Little Help from another person bathing (including washing, rinsing, drying)?: A Little Help from another person to put on and taking off regular upper body clothing?: None Help from another person to put on and taking off regular lower body clothing?: None 6 Click Score: 22    End of Session Equipment Utilized During Treatment: Gait belt;Rolling walker  OT Visit Diagnosis: Unsteadiness on feet (R26.81);Muscle weakness (generalized) (M62.81)   Activity Tolerance Patient tolerated treatment well   Patient Left in chair;with call bell/phone within reach   Nurse Communication Other (comment) (rn  assisted with guiding iv through pts. house coat)        Time: 4098-1191 OT Time Calculation (min): 17 min  Charges: OT General Charges $OT Visit: 1 Visit OT Treatments $Self Care/Home Management : 8-22 mins  Robet Leu, COTA/L 07/21/2017, 9:05 AM

## 2017-07-21 NOTE — Care Management Obs Status (Signed)
MEDICARE OBSERVATION STATUS NOTIFICATION   Patient Details  Name: Tanya Little MRN: [id number] Date of Birth: 1931/12/08   Medicare Observation Status Notification Given:  Yes    CrutchfieldDerrill Memo, RN 07/21/2017, 1:55 PM

## 2017-07-22 DIAGNOSIS — I482 Chronic atrial fibrillation: Secondary | ICD-10-CM | POA: Diagnosis not present

## 2017-07-22 DIAGNOSIS — N390 Urinary tract infection, site not specified: Secondary | ICD-10-CM | POA: Diagnosis not present

## 2017-07-22 DIAGNOSIS — G250 Essential tremor: Secondary | ICD-10-CM | POA: Diagnosis not present

## 2017-07-22 DIAGNOSIS — E871 Hypo-osmolality and hyponatremia: Secondary | ICD-10-CM | POA: Diagnosis not present

## 2017-07-22 MED ORDER — ZOLPIDEM TARTRATE 5 MG PO TABS
5.0000 mg | ORAL_TABLET | Freq: Once | ORAL | Status: AC
  Administered 2017-07-22: 5 mg via ORAL
  Filled 2017-07-22: qty 1

## 2017-07-22 MED ORDER — FOSFOMYCIN TROMETHAMINE 3 G PO PACK
3.0000 g | PACK | Freq: Once | ORAL | Status: AC
Start: 2017-07-22 — End: 2017-07-22
  Administered 2017-07-22: 3 g via ORAL
  Filled 2017-07-22: qty 3

## 2017-07-22 MED ORDER — DONEPEZIL HCL 5 MG PO TABS: 10.0000 mg | ORAL_TABLET | Freq: Every day | ORAL | 0 refills | Status: AC

## 2017-07-22 NOTE — Care Management Note (Addendum)
Case Management Note  Patient Details  Name: Tanya Little MRN: [id number] Date of Birth: [birth year]  Subjective/Objective:                    Action/Plan:  Discussed discharge planning with daughter [name]. [name] understands her mother does not need SNF for short term rehab. [name] looking at long term placement due to dementia and her mother wondering. [name] can take her mother home at discharge and provide supervision until patient can be placed. Debra requesting home health with [facility] ( already active) will request [facility] and SW to assist with palcement from home.   Called PCP Dr Jeannetta Nap office spoke with Shanda Bumps and Shanda Bumps will update Dr Jeannetta Nap.   Expected Discharge Date:                  Expected Discharge Plan:  Home w Home Health Services  In-House Referral:  Clinical Social Work  Discharge planning Services  CM Consult  Post Acute Care Choice:  Durable Medical Equipment, Home Health Choice offered to:  Adult Children  DME Arranged:    DME Agency:     HH Arranged:  RN, Social Work [employer] Agency:  [employer] (now [facility])  Status of Service: completed  If discussed at [employer], dates discussed:    Additional Comments:  Kingsley Plan, RN 07/22/2017, 10:20 AM

## 2017-07-22 NOTE — Social Work (Signed)
CSW met with daughter at bedside to discuss options for patient.  Pt will go home with daughter and have home services. Daughter will f/u with social service to discuss options for home services or facility permanent placement. Daughter indicated that mom financial may not meet guidelines and CSW discussed spend down process briefly. Daughter indicated that she does not think mom will do well in ALF. CSW validated and encouraged her to start with social service of [place] to discuss options for long term planning needs. At present  They pay out of pocket for home aid as they are able to afford but they will not be able to continue for long term. Daughter in agreement with plan to start at [place] [facility] to start the process.  CSW will sign off for now as social work intervention is no longer needed. Please consult [place] again if new need arises.  Elissa Hefty, LCSW Clinical Social Worker [phone]

## 2017-07-22 NOTE — Discharge Summary (Signed)
Physician Discharge Summary  Tanya Little ZOX:[id number] DOB: 1931-11-07  PCP: Leonard Downing, MD  Admit date: 07/19/2017 Discharge date: 07/22/2017  Recommendations for Outpatient Follow-up:  1. Dr. Claris Gower, PCP in one week with repeat labs (CBC & BMP).  Home Health: RN and Education officer, museum Equipment/Devices: None    Discharge Condition: Improved and stable  CODE STATUS: DO NOT RESUSCITATE  Diet recommendation: Heart healthy diet.  Discharge Diagnoses:  Active Problems:   Dysphagia, pharyngoesophageal phase   Essential tremor   Esophageal stricture   Hyponatremia   Recurrent UTI   Protein-calorie malnutrition, severe   Chronic atrial fibrillation (HCC)   GERD (gastroesophageal reflux disease)   Generalized weakness   Brief Summary: 83 year old female, lives alone, family lives nearby, PMH of reportedly advanced dementia, chronic atrial fibrillation on Xarelto, esophageal stricture status post multiple dilatations but consumes normal diet, hiatal hernia, HTN, essential tremor, recurrent UTIs (apparently this is her eighth UTI since March this year), presented to ED with progressively worsening generalized weakness, not being herself lately, low back pain and suprapubic pain. Admitted for possible recurrent UTI.   Assessment & Plan:   1. Acute lower UTI, Pseudomonas: Likely complicated by poor GU hygiene from her dementia. Family reports multiple UTI's since March 2018. Empirically started on ceftriaxone which was changed to IV Fortaz (sensitive). Normal renal ultrasound. Completed approximately 3 days of IV Fortaz. As discussed with infectious disease M.D. on call. We'll complete treatment with a dose of oral Fosfomycin prior to discharge. Patient's daughter was counseled regarding importance of local GU hygiene to prevent further UTIs and she is well aware of this. Patient will be returning to stay with her daughter. 2. Dehydration: Reportedly does not drink much  fluids. Resolved after IV fluids. Encouraged oral intake. 3. Generalized weakness/adult failure to thrive/dementia: As per daughter/healthcare power of attorney at bedside, patient unable to take care of herself and family unable to assist. However PT and OT recommended no follow-up. At this time, clinical social worker and case management met with patient's daughter and the plan is for patient to return with her daughter and home health services. 4. Chronic atrial fibrillation: Continue propranolol ER and Xarelto.  5. Essential hypertension: Controlled. 6. Essential tremor: Probably on propranolol for this and her hypertension. Status post brain stimulator inserted at [facility]. 7. GERD/esophageal stricture status post dilatation/hiatal hernia: As per family, patient tolerates regular diet at home without dysphagia or aspiration noted. They indicated that patient will refuse to eat pured diet. Regular consistency diet as tolerated. 8. Hyponatremia: Some of this may be due to dehydration. Stable.  9. Anemia: Stable    Consultants:  None   Procedures:  None   Discharge Instructions  Discharge Instructions    Call MD for:    Complete by:  As directed    Worsening confusion or altered mental status.   Call MD for:  extreme fatigue    Complete by:  As directed    Call MD for:  persistant dizziness or light-headedness    Complete by:  As directed    Call MD for:  temperature >100.4    Complete by:  As directed    Diet - low sodium heart healthy    Complete by:  As directed    Increase activity slowly    Complete by:  As directed        Medication List    STOP taking these medications   nitrofurantoin 100 MG capsule Commonly known as:  MACRODANTIN  TAKE these medications   acetaminophen 500 MG tablet Commonly known as:  TYLENOL Take 500 mg by mouth every 6 (six) hours as needed.   bisacodyl 5 MG EC tablet Commonly known as:  DULCOLAX Take 1 tablet (5 mg total) by mouth  daily as needed for moderate constipation.   calcium carbonate 500 MG chewable tablet Commonly known as:  TUMS - dosed in mg elemental calcium Chew 1 tablet (200 mg of elemental calcium total) by mouth 3 (three) times daily as needed for indigestion or heartburn.   CENTRUM SILVER PO Take 1 tablet by mouth daily.   donepezil 5 MG tablet Commonly known as:  ARICEPT Take 2 tablets (10 mg total) by mouth at bedtime. What changed:  how much to take   feeding supplement (ENSURE ENLIVE) Liqd Take 237 mLs by mouth 2 (two) times daily between meals. What changed:  when to take this  reasons to take this   polyethylene glycol packet Commonly known as:  MIRALAX / GLYCOLAX Take 17 g by mouth 2 (two) times daily.   potassium chloride 10 MEQ tablet Commonly known as:  K-DUR,KLOR-CON Take 10 mEq by mouth daily.   propranolol ER 80 MG 24 hr capsule Commonly known as:  INDERAL LA Take 1 capsule (80 mg total) by mouth daily.   ranitidine 300 MG tablet Commonly known as:  ZANTAC Take 300 mg by mouth daily.   Rivaroxaban 15 MG Tabs tablet Commonly known as:  XARELTO Take 1 tablet (15 mg total) by mouth daily with breakfast. What changed:  Another medication with the same name was removed. Continue taking this medication, and follow the directions you see here.   senna 8.6 MG Tabs tablet Commonly known as:  SENOKOT Take 1 tablet (8.6 mg total) by mouth 2 (two) times daily.   traZODone 150 MG tablet Commonly known as:  DESYREL Take 150 mg by mouth at bedtime.      Follow-up Information    Leonard Downing, MD. Schedule an appointment as soon as possible for a visit in 1 week(s).   Specialty:  Family Medicine Why:  To be seen with repeat labs (CBC & BMP). Contact information: [place] [address] [phone]          Allergies  Allergen Reactions  . Sulfa Antibiotics       Procedures/Studies: Dg Chest 2 View  Result Date:  07/19/2017 CLINICAL DATA:  Weakness. EXAM: CHEST  2 VIEW COMPARISON:  Radiographs of March 27, 2017. FINDINGS: The heart size and mediastinal contours are within normal limits. Both lungs are clear. Atherosclerosis of thoracic aorta is noted. No pneumothorax or pleural effusion is noted. Old right rib fractures are noted. IMPRESSION: No active cardiopulmonary disease.  Aortic atherosclerosis. Electronically Signed   By: Marijo Conception, M.D.   On: 07/19/2017 08:18   US Renal  Result Date: 07/19/2017 CLINICAL DATA:  Recurrent UTI EXAM: RENAL / URINARY TRACT ULTRASOUND COMPLETE COMPARISON:  None. FINDINGS: Right Kidney: Length: 9.7 cm. Echogenicity within normal limits. No mass or hydronephrosis visualized. Left Kidney: Length: 9.7 cm. Echogenicity within normal limits. No mass or hydronephrosis visualized. Bladder: Appears normal for degree of bladder distention. Other: Incidence note made of gallstones. IMPRESSION: Normal renal ultrasound. Electronically Signed   By: Kathreen Devoid   On: 07/19/2017 11:38      Subjective: Pleasantly confused. Denies symptoms. States that she feels "good". Also reports that she's been trying to drink more liquids. Daughter at bedside.  Discharge Exam:  Vitals:   07/21/17 1411 07/21/17 2009 07/22/17 0500 07/22/17 0533  BP: 105/70 (!) 109/59  101/65  Pulse: 75 67  69  Resp: '18 18  16  ' Temp: 98 F (36.7 C) 98 F (36.7 C)  98.2 F (36.8 C)  TempSrc: Oral Oral    SpO2: 100% 99%  98%  Weight:   56.7 kg (125 lb)   Height:        General exam: Pleasant elderly female, moderately built and frail, lying comfortably supine in bed. Oral mucosa moist. Respiratory system: Clear to auscultation. Respiratory effort normal.  Cardiovascular system: S1 & S2 heard, RRR. No JVD, murmurs, rubs, gallops or clicks. No pedal edema.  Gastrointestinal system: Abdomen is nondistended, soft and nontender. No organomegaly or masses felt. Normal bowel sounds heard.  Central nervous  system: Alert and oriented to person and place but not to time. No focal neurological deficits. Extremities: Symmetric 5 x 5 power. Skin: No rashes, lesions or ulcers Psychiatry: Judgement and insight impaired. Mood & affect appropriate.    The results of significant diagnostics from this hospitalization (including imaging, microbiology, ancillary and laboratory) are listed below for reference.     Microbiology: Recent Results (from the past 240 hour(s))  Urine culture     Status: Abnormal   Collection Time: 07/19/17 10:00 AM  Result Value Ref Range Status   Specimen Description URINE, CLEAN CATCH  Final   Special Requests NONE  Final   Culture >=100,000 COLONIES/mL PSEUDOMONAS AERUGINOSA (A)  Final   Report Status 07/21/2017 FINAL  Final   Organism ID, Bacteria PSEUDOMONAS AERUGINOSA (A)  Final      Susceptibility   Pseudomonas aeruginosa - MIC*    CEFTAZIDIME 4 SENSITIVE Sensitive     CIPROFLOXACIN <=0.25 SENSITIVE Sensitive     GENTAMICIN 2 SENSITIVE Sensitive     IMIPENEM <=0.25 SENSITIVE Sensitive     PIP/TAZO 8 SENSITIVE Sensitive     CEFEPIME 4 SENSITIVE Sensitive     * >=100,000 COLONIES/mL PSEUDOMONAS AERUGINOSA     Labs: CBC:  Recent Labs Lab 07/19/17 0800 07/20/17 0505 07/21/17 0404  WBC 9.8 7.1 6.4  NEUTROABS 7.7  --   --   HGB 11.7* 10.9* 10.5*  HCT 34.4* 32.3* 31.2*  MCV 89.1 89.7 90.2  PLT 232 224 794   Basic Metabolic Panel:  Recent Labs Lab 07/19/17 0800 07/20/17 0505 07/21/17 0404  NA 132* 130* 131*  K 3.8 4.2 4.1  CL 98* 100* 100*  CO2 24 21* 23  GLUCOSE 109* 107* 93  BUN '13 12 12  ' CREATININE 0.91 0.77 0.84  CALCIUM 9.1 8.1* 8.1*   Liver Function Tests:  Recent Labs Lab 07/19/17 0800 07/20/17 0505  AST 17 19  ALT 13* 11*  ALKPHOS 49 40  BILITOT 0.8 0.8  PROT 6.6 5.6*  ALBUMIN 3.6 2.9*   BNP (last 3 results)  Recent Labs  03/27/17 0351  BNP 142.0*   Cardiac Enzymes:  Recent Labs Lab 07/19/17 0800  TROPONINI <0.03    Urinalysis    Component Value Date/Time   COLORURINE YELLOW 07/19/2017 0907   APPEARANCEUR HAZY (A) 07/19/2017 0907   LABSPEC 1.008 07/19/2017 0907   PHURINE 7.0 07/19/2017 [place] 07/19/2017 0907   HGBUR NEGATIVE 07/19/2017 [place] NEGATIVE 07/19/2017 [place] 07/19/2017 0907   PROTEINUR NEGATIVE 07/19/2017 0907   NITRITE POSITIVE (A) 07/19/2017 0907   LEUKOCYTESUR LARGE (A) 07/19/2017 3276  Discussed in detail with patient's daughter at bedside. Updated care and answered questions.  Time coordinating discharge: Over 30 minutes  SIGNED:  Vernell Leep, MD, FACP, [place]. [facility] Pager [phone]  If 7PM-7AM, please contact night-coverage [email or web address] Password [email or web address] 07/22/2017, 2:43 PM

## 2017-10-23 ENCOUNTER — Emergency Department (HOSPITAL_COMMUNITY)
Admission: EM | Admit: 2017-10-23 | Discharge: 2017-10-24 | Disposition: A | Discharge status: Home / Self Care | Payer: Medicare Other | Attending: Emergency Medicine | Admitting: Emergency Medicine

## 2017-10-23 ENCOUNTER — Encounter (HOSPITAL_COMMUNITY): Payer: Self-pay

## 2017-10-23 ENCOUNTER — Emergency Department (HOSPITAL_COMMUNITY): Payer: Medicare Other

## 2017-10-23 DIAGNOSIS — Z87891 Personal history of nicotine dependence: Secondary | ICD-10-CM | POA: Diagnosis not present

## 2017-10-23 DIAGNOSIS — Z79899 Other long term (current) drug therapy: Secondary | ICD-10-CM | POA: Insufficient documentation

## 2017-10-23 DIAGNOSIS — Z7901 Long term (current) use of anticoagulants: Secondary | ICD-10-CM | POA: Insufficient documentation

## 2017-10-23 DIAGNOSIS — I10 Essential (primary) hypertension: Secondary | ICD-10-CM | POA: Diagnosis not present

## 2017-10-23 DIAGNOSIS — M6281 Muscle weakness (generalized): Secondary | ICD-10-CM | POA: Insufficient documentation

## 2017-10-23 DIAGNOSIS — F0390 Unspecified dementia without behavioral disturbance: Secondary | ICD-10-CM | POA: Insufficient documentation

## 2017-10-23 DIAGNOSIS — R531 Weakness: Secondary | ICD-10-CM

## 2017-10-23 DIAGNOSIS — Z8659 Personal history of other mental and behavioral disorders: Secondary | ICD-10-CM

## 2017-10-23 LAB — CBC WITH DIFFERENTIAL/PLATELET
BASOS PCT: 1 %
Basophils Absolute: 0 10*3/uL (ref 0.0–0.1)
Eosinophils Absolute: 0.2 10*3/uL (ref 0.0–0.7)
Eosinophils Relative: 4 %
HEMATOCRIT: 34.2 % — AB (ref 36.0–46.0)
HEMOGLOBIN: 11.6 g/dL — AB (ref 12.0–15.0)
LYMPHS PCT: 31 %
Lymphs Abs: 2.1 10*3/uL (ref 0.7–4.0)
MCH: 30.7 pg (ref 26.0–34.0)
MCHC: 33.9 g/dL (ref 30.0–36.0)
MCV: 90.5 fL (ref 78.0–100.0)
Monocytes Absolute: 0.6 10*3/uL (ref 0.1–1.0)
Monocytes Relative: 9 %
NEUTROS ABS: 3.9 10*3/uL (ref 1.7–7.7)
NEUTROS PCT: 55 %
Platelets: 211 10*3/uL (ref 150–400)
RBC: 3.78 MIL/uL — ABNORMAL LOW (ref 3.87–5.11)
RDW: 12.7 % (ref 11.5–15.5)
WBC: 6.8 10*3/uL (ref 4.0–10.5)

## 2017-10-23 LAB — URINALYSIS, ROUTINE W REFLEX MICROSCOPIC
BILIRUBIN URINE: NEGATIVE
Glucose, UA: NEGATIVE mg/dL
HGB URINE DIPSTICK: NEGATIVE
KETONES UR: NEGATIVE mg/dL
NITRITE: POSITIVE — AB
PH: 7 (ref 5.0–8.0)
Protein, ur: NEGATIVE mg/dL
Specific Gravity, Urine: 1.011 (ref 1.005–1.030)

## 2017-10-23 LAB — COMPREHENSIVE METABOLIC PANEL
ALK PHOS: 41 U/L (ref 38–126)
ALT: 11 U/L — ABNORMAL LOW (ref 14–54)
ANION GAP: 7 (ref 5–15)
AST: 16 U/L (ref 15–41)
Albumin: 3.2 g/dL — ABNORMAL LOW (ref 3.5–5.0)
BILIRUBIN TOTAL: 0.6 mg/dL (ref 0.3–1.2)
BUN: 18 mg/dL (ref 6–20)
CALCIUM: 8.8 mg/dL — AB (ref 8.9–10.3)
CO2: 25 mmol/L (ref 22–32)
Chloride: 101 mmol/L (ref 101–111)
Creatinine, Ser: 0.99 mg/dL (ref 0.44–1.00)
GFR calc non Af Amer: 51 mL/min — ABNORMAL LOW (ref >60)
GFR, EST AFRICAN AMERICAN: 59 mL/min — AB (ref >60)
Glucose, Bld: 96 mg/dL (ref 65–99)
Potassium: 4.1 mmol/L (ref 3.5–5.1)
SODIUM: 133 mmol/L — AB (ref 135–145)
TOTAL PROTEIN: 5.8 g/dL — AB (ref 6.5–8.1)

## 2017-10-23 NOTE — ED Triage Notes (Signed)
Pt from home via [facility] with complaints of being weak and lethargic over the last 2 days. Pt has a hx of recurrent UTI. Has had a hx of dementia. AO x3.

## 2017-10-23 NOTE — ED Notes (Signed)
Bed: [id number] Expected date:  Expected time:  Means of arrival:  Comments: 84 yr old lethargic, UTI

## 2017-10-23 NOTE — ED Provider Notes (Signed)
[facility]-EMERGENCY DEPT Provider Note   CSN: [id number] Arrival date & time: 10/23/17  2154     History   Chief Complaint Chief Complaint  Patient presents with  . Weakness    HPI Tanya Little is a 84 y.o. female.  Patient here with daughter who provides history secondary to patient's history of dementia. She is cared for at home. Daughter reports her dementia has progressed rapidly over the last 3 months. She also reports she has a persists/chronic UTI for which she is currently on Cefdinir. She is brought in today after the home nurse felt her breathing sounded "rattlely", found her difficult to waken and with increased confusion over her baseline. No known fever. There is no report of vomiting. She has not complained of pain. Daughter states it is increasingly difficult to get her to drink any fluids. The patient has no complaints.    The history is provided by the patient. No language interpreter was used.    Past Medical History:  Diagnosis Date  . Atrial fibrillation (HCC)   . Barrett's esophagus   . Esophageal stricture   . Essential tremor   . GERD (gastroesophageal reflux disease)   . Hiatal hernia   . Hypertension     Patient Active Problem List   Diagnosis Date Noted  . GERD (gastroesophageal reflux disease) 07/19/2017  . Generalized weakness 07/19/2017  . Impaction of the bowels (HCC) 03/30/2017  . Constipation 03/29/2017  . Chronic atrial fibrillation (HCC)   . Protein-calorie malnutrition, severe 03/28/2017  . Atrial fibrillation with rapid ventricular response (HCC) 03/28/2017  . Acute dyspnea 03/27/2017  . Chest pain at rest 03/27/2017  . Recurrent UTI 03/27/2017  . Acute encephalopathy 12/28/2016  . Hyponatremia 12/28/2016  . Atrial fibrillation with RVR (HCC) 12/28/2016  . Acute lower UTI 12/28/2016  . Left-sided weakness 12/28/2016  . Esophageal stricture 09/06/2014  . Dysphagia, pharyngoesophageal phase 08/26/2014  .  Encounter for colorectal cancer screening 08/26/2014  . Essential tremor 08/26/2014    Past Surgical History:  Procedure Laterality Date  . ABDOMINAL HYSTERECTOMY    . BALLOON DILATION N/A 09/06/2014   Procedure: BALLOON DILATION;  Surgeon: Louis Meckelobert D Kaplan, MD;  Location[facility];  Service: Endoscopy;  Laterality: N/A;  . DEEP BRAIN STIMULATOR PLACEMENT  02/13/2017   [facility]  . ESOPHAGOGASTRODUODENOSCOPY N/A 09/06/2014   Procedure: ESOPHAGOGASTRODUODENOSCOPY (EGD);  Surgeon: Louis Meckelobert D Kaplan, MD;  Location[facility];  Service: Endoscopy;  Laterality: N/A;  . ESOPHAGOSCOPY WITH DILITATION    . KNEE ARTHROSCOPY Right   . ROTATOR CUFF REPAIR Right     OB History    No data available       Home Medications    Prior to Admission medications   Medication Sig Start Date End Date Taking? Authorizing Provider  acetaminophen (TYLENOL) 500 MG tablet Take 500 mg by mouth every 6 (six) hours as needed for mild pain.  03/14/17  Yes [provider]  bisacodyl (DULCOLAX) 5 MG EC tablet Take 1 tablet (5 mg total) by mouth daily as needed for moderate constipation. 12/31/16  Yes Elgergawy, Leana Roeawood S, MD  cefdinir (OMNICEF) 300 MG capsule Take 300 mg by mouth 2 (two) times daily.   Yes [provider]  donepezil (ARICEPT) 5 MG tablet Take 2 tablets (10 mg total) by mouth at bedtime. Patient taking differently: Take 20 mg by mouth at bedtime.  07/22/17  Yes Hongalgi, Maximino GreenlandAnand D, MD  feeding supplement, ENSURE ENLIVE, (ENSURE ENLIVE) LIQD  Take 237 mLs by mouth 2 (two) times daily between meals. Patient taking differently: Take 237 mLs by mouth daily as needed (meal replacement).  12/31/16  Yes Elgergawy, Leana Roe, MD  Multiple Vitamins-Minerals (CENTRUM SILVER PO) Take 1 tablet by mouth daily.   Yes [provider]  polyethylene glycol (MIRALAX / GLYCOLAX) packet Take 17 g by mouth 2 (two) times daily. Patient taking differently: Take 17 g by mouth daily as needed  for mild constipation.  03/30/17  Yes Rodolph Bong, MD  potassium chloride (K-DUR,KLOR-CON) 10 MEQ tablet Take 10 mEq by mouth daily.   Yes [provider]  propranolol ER (INDERAL LA) 80 MG 24 hr capsule Take 1 capsule (80 mg total) by mouth daily. 03/31/17  Yes Rodolph Bong, MD  QUEtiapine (SEROQUEL) 25 MG tablet Take 25-50 mg by mouth 3 (three) times daily. 25 MG at 9 AM and 25 MG at 3 PM. 50 Mg at night,   Yes [provider]  ranitidine (ZANTAC) 300 MG tablet Take 300 mg by mouth daily.   Yes [provider]  rivaroxaban (XARELTO) 20 MG TABS tablet Take 20 mg by mouth daily.   Yes [provider]  senna (SENOKOT) 8.6 MG TABS tablet Take 1 tablet (8.6 mg total) by mouth 2 (two) times daily. Patient taking differently: Take 1 tablet by mouth daily.  03/30/17  Yes Rodolph Bong, MD  traZODone (DESYREL) 150 MG tablet Take 150 mg by mouth at bedtime.   Yes [provider]  calcium carbonate (TUMS - DOSED IN MG ELEMENTAL CALCIUM) 500 MG chewable tablet Chew 1 tablet (200 mg of elemental calcium total) by mouth 3 (three) times daily as needed for indigestion or heartburn. Patient not taking: Reported on 10/23/2017 12/31/16   Elgergawy, Leana Roe, MD  Rivaroxaban (XARELTO) 15 MG TABS tablet Take 1 tablet (15 mg total) by mouth daily with breakfast. Patient not taking: Reported on 10/23/2017 03/31/17   Rodolph Bong, MD    Family History Family History  Problem Relation Age of Onset  . Diabetes Sister   . Lung cancer Brother   . Cancer Other        nephew  . Aneurysm Brother        x 3    Social History Social History   Tobacco Use  . Smoking status: Former Smoker    Types: Cigarettes    Last attempt to quit: 08/26/2004    Years since quitting: 13.1  . Smokeless tobacco: Never Used  Substance Use Topics  . Alcohol use: No    Alcohol/week: 0.0 oz  . Drug use: No     Allergies   Sulfa antibiotics   Review of  Systems Review of Systems  Unable to perform ROS: Dementia     Physical Exam Updated Vital Signs BP 140/79 (BP Location: Left Arm)   Pulse 60   Temp 97.6 F (36.4 C) (Oral)   Resp 16   SpO2 97%   Physical Exam  Constitutional: She appears well-developed and well-nourished.  HENT:  Head: Normocephalic.  Neck: Normal range of motion. Neck supple.  Cardiovascular: Normal rate and regular rhythm.  No murmur heard. Pacer visualized in left chest.  Pulmonary/Chest: Effort normal and breath sounds normal.  Left rhonchi present.  Abdominal: Soft. Bowel sounds are normal. There is no tenderness. There is no rebound and no guarding.  Musculoskeletal: Normal range of motion.  Neurological: She is alert.  Patient is confused. She follows commands  well. No lateralizing weakness.   Skin: Skin is warm and dry. No rash noted.  Psychiatric: She has a normal mood and affect.     ED Treatments / Results  Labs (all labs ordered are listed, but only abnormal results are displayed) Labs Reviewed  URINE CULTURE  COMPREHENSIVE METABOLIC PANEL  CBC WITH DIFFERENTIAL/PLATELET  URINALYSIS, ROUTINE W REFLEX MICROSCOPIC    EKG  EKG Interpretation None       Radiology No results found.  Procedures Procedures (including critical care time)  Medications Ordered in ED Medications - No data to display   Initial Impression / Assessment and Plan / ED Course  I have reviewed the triage vital signs and the nursing notes.  Pertinent labs & imaging results that were available during my care of the patient were reviewed by me and considered in my medical decision making (see chart for details).     Patient here with daughter who provides history secondary to patient's dementia. She is concerned about increased hallucinations and confusion, worried about possible infection.  The patient has no fever. She is awake and alert. She is neurologically intact but confused. No evidence of  infection other than urinary for which she is still taking antibiotics. Review of previous cultures show consistent pansensitivity. No change.   Daughter prefers discharge if admission is not needed as she is very close to being placed in a nursing home and family would like to pursue placement. She can be discharged home.  Final Clinical Impressions(s) / ED Diagnoses   Final diagnoses:  None   1. Dementia 2. Weakness   ED Discharge Orders    None       Elpidio Anis, Cordelia Poche 10/24/17 1610    Benjiman Core, MD 10/25/17 [phone]

## 2017-10-24 MED ORDER — ACETAMINOPHEN 500 MG PO TABS
1000.0000 mg | ORAL_TABLET | Freq: Once | ORAL | Status: AC
  Administered 2017-10-24: 1000 mg via ORAL
  Filled 2017-10-24: qty 2

## 2017-10-24 NOTE — Discharge Instructions (Signed)
Results for orders placed or performed during the hospital encounter of 10/23/17  Comprehensive metabolic panel  Result Value Ref Range   Sodium 133 (L) 135 - 145 mmol/L   Potassium 4.1 3.5 - 5.1 mmol/L   Chloride 101 101 - 111 mmol/L   CO2 25 22 - 32 mmol/L   Glucose, Bld 96 65 - 99 mg/dL   BUN 18 6 - 20 mg/dL   Creatinine, Ser 2.950.99 0.44 - 1.00 mg/dL   Calcium 8.8 (L) 8.9 - 10.3 mg/dL   Total Protein 5.8 (L) 6.5 - 8.1 g/dL   Albumin 3.2 (L) 3.5 - 5.0 g/dL   AST 16 15 - 41 U/L   ALT 11 (L) 14 - 54 U/L   Alkaline Phosphatase 41 38 - 126 U/L   Total Bilirubin 0.6 0.3 - 1.2 mg/dL   GFR calc non Af Amer 51 (L) >60 mL/min   GFR calc Af Amer 59 (L) >60 mL/min   Anion gap 7 5 - 15  CBC with Differential  Result Value Ref Range   WBC 6.8 4.0 - 10.5 K/uL   RBC 3.78 (L) 3.87 - 5.11 MIL/uL   Hemoglobin 11.6 (L) 12.0 - 15.0 g/dL   HCT 62.134.2 (L) 30.836.0 - 65.746.0 %   MCV 90.5 78.0 - 100.0 fL   MCH 30.7 26.0 - 34.0 pg   MCHC 33.9 30.0 - 36.0 g/dL   RDW 84.612.7 96.211.5 - 95.215.5 %   Platelets 211 150 - 400 K/uL   Neutrophils Relative % 55 %   Neutro Abs 3.9 1.7 - 7.7 K/uL   Lymphocytes Relative 31 %   Lymphs Abs 2.1 0.7 - 4.0 K/uL   Monocytes Relative 9 %   Monocytes Absolute 0.6 0.1 - 1.0 K/uL   Eosinophils Relative 4 %   Eosinophils Absolute 0.2 0.0 - 0.7 K/uL   Basophils Relative 1 %   Basophils Absolute 0.0 0.0 - 0.1 K/uL  Urinalysis, Routine w reflex microscopic  Result Value Ref Range   Color, Urine YELLOW YELLOW   APPearance CLOUDY (A) CLEAR   Specific Gravity, Urine 1.011 1.005 - 1.030   pH 7.0 5.0 - 8.0   Glucose, UA NEGATIVE NEGATIVE mg/dL   Hgb urine dipstick NEGATIVE NEGATIVE   Bilirubin Urine NEGATIVE NEGATIVE   Ketones, ur NEGATIVE NEGATIVE mg/dL   Protein, ur NEGATIVE NEGATIVE mg/dL   Nitrite POSITIVE (A) NEGATIVE   Leukocytes, UA LARGE (A) NEGATIVE   RBC / HPF 0-5 0 - 5 RBC/hpf   WBC, UA TOO NUMEROUS TO COUNT 0 - 5 WBC/hpf   Bacteria, UA RARE (A) NONE SEEN   Squamous  Epithelial / LPF 0-5 (A) NONE SEEN   WBC Clumps PRESENT    Mucus PRESENT    Amorphous Crystal PRESENT    Dg Chest Portable 1 View  Result Date: 10/23/2017 CLINICAL DATA:  Cough and weakness EXAM: PORTABLE CHEST 1 VIEW COMPARISON:  Chest radiograph 07/19/2017 FINDINGS: Nerve stimulator device overlies the left chest wall. The lungs are clear. Normal cardiomediastinal contours. The left costophrenic angle is excluded from view. Unchanged appearance of old right rib fractures. IMPRESSION: No active disease. Electronically Signed   By: Deatra RobinsonKevin  Herman M.D.   On: 10/23/2017 23:59    Please return to the ED with any new or concerning symptoms.

## 2017-10-26 LAB — URINE CULTURE: Culture: >=100000 — AB

## 2017-10-27 ENCOUNTER — Telehealth: Payer: Self-pay

## 2017-10-27 NOTE — Telephone Encounter (Signed)
Post ED Visit - Positive Culture Follow-up: Unsuccessful Patient Follow-up  Culture assessed and recommendations reviewed by:  []  Enzo BiNathan Batchelder, Pharm.D. []  Celedonio MiyamotoJeremy Frens, Pharm.D., BCPS AQ-ID [x]  Garvin FilaMike Maccia, Pharm.D., BCPS []  Georgina PillionElizabeth Martin, Pharm.D., BCPS []  [place]h Pham, [place]arm.D., BCPS, AAHIVP []  Estella HuskMichelle Turner, Pharm.D., BCPS, AAHIVP []  Lysle Pearlachel Rumbarger, PharmD, BCPS []  Casilda Carlsaylor Stone, PharmD, BCPS []  Pollyann SamplesAndy Johnston, PharmD, BCPS  Positive urine culture  []  Patient discharged without antimicrobial prescription and treatment is now indicated [x]  Organism is resistant to prescribed ED discharge antimicrobial []  Patient with positive blood cultures   Unable to contact patient after 3 attempts, letter will be sent to address on file  Jerry CarasCullom, Tanya Little 10/27/2017, 3:55 PM

## 2017-10-27 NOTE — Progress Notes (Signed)
ED Antimicrobial Stewardship Positive Culture Follow Up   Tanya AlvineBetty G Little is an 84 y.o. female who [facility] with a chief complaint of  Chief Complaint  Patient presents with  . Weakness    Recent Results (from the past 720 hour(s))  Urine culture     Status: Abnormal   Collection Time: 10/23/17 10:52 PM  Result Value Ref Range Status   Specimen Description URINE, CLEAN CATCH  Final   Special Requests NONE  Final   Culture >=100,000 COLONIES/mL PSEUDOMONAS AERUGINOSA (A)  Final   Report Status 10/26/2017 FINAL  Final   Organism ID, Bacteria PSEUDOMONAS AERUGINOSA (A)  Final      Susceptibility   Pseudomonas aeruginosa - MIC*    CEFTAZIDIME <=1 SENSITIVE Sensitive     CIPROFLOXACIN <=0.25 SENSITIVE Sensitive     GENTAMICIN <=1 SENSITIVE Sensitive     IMIPENEM 1 SENSITIVE Sensitive     PIP/TAZO 8 SENSITIVE Sensitive     CEFEPIME <=1 SENSITIVE Sensitive     * >=100,000 COLONIES/mL PSEUDOMONAS AERUGINOSA    [x]  Treated with cefdinir, organism resistant to prescribed antimicrobial []  Patient discharged originally without antimicrobial agent and treatment is now indicated  New antibiotic prescription: levofloxacin 250 mg qd x 3  ED Provider: Jodi Geraldskelsey ford, pa-c  Bertram MillardMichael A Keva Darty 10/27/2017, 3:32 PM Infectious Diseases Pharmacist Phone# [phone]

## 2018-08-18 ENCOUNTER — Telehealth: Payer: Self-pay | Admitting: Emergency Medicine

## 2018-08-18 NOTE — Telephone Encounter (Signed)
Lost to followup 

## 2019-07-09 ENCOUNTER — Emergency Department (HOSPITAL_COMMUNITY): Payer: Medicare Other

## 2019-07-09 ENCOUNTER — Emergency Department (HOSPITAL_COMMUNITY)
Admission: EM | Admit: 2019-07-09 | Discharge: 2019-07-09 | Disposition: A | Discharge status: Home / Self Care | Payer: Medicare Other | Attending: Emergency Medicine | Admitting: Emergency Medicine

## 2019-07-09 ENCOUNTER — Other Ambulatory Visit: Payer: Self-pay

## 2019-07-09 DIAGNOSIS — F0390 Unspecified dementia without behavioral disturbance: Secondary | ICD-10-CM | POA: Insufficient documentation

## 2019-07-09 DIAGNOSIS — I10 Essential (primary) hypertension: Secondary | ICD-10-CM | POA: Insufficient documentation

## 2019-07-09 DIAGNOSIS — Y999 Unspecified external cause status: Secondary | ICD-10-CM | POA: Diagnosis not present

## 2019-07-09 DIAGNOSIS — Y92128 Other place in nursing home as the place of occurrence of the external cause: Secondary | ICD-10-CM | POA: Insufficient documentation

## 2019-07-09 DIAGNOSIS — Z79899 Other long term (current) drug therapy: Secondary | ICD-10-CM | POA: Diagnosis not present

## 2019-07-09 DIAGNOSIS — N3001 Acute cystitis with hematuria: Secondary | ICD-10-CM | POA: Diagnosis not present

## 2019-07-09 DIAGNOSIS — I482 Chronic atrial fibrillation, unspecified: Secondary | ICD-10-CM | POA: Insufficient documentation

## 2019-07-09 DIAGNOSIS — Z87891 Personal history of nicotine dependence: Secondary | ICD-10-CM | POA: Insufficient documentation

## 2019-07-09 DIAGNOSIS — Y939 Activity, unspecified: Secondary | ICD-10-CM | POA: Diagnosis not present

## 2019-07-09 DIAGNOSIS — Z7901 Long term (current) use of anticoagulants: Secondary | ICD-10-CM | POA: Diagnosis not present

## 2019-07-09 DIAGNOSIS — S0990XA Unspecified injury of head, initial encounter: Secondary | ICD-10-CM | POA: Insufficient documentation

## 2019-07-09 DIAGNOSIS — Z9682 Presence of neurostimulator: Secondary | ICD-10-CM | POA: Insufficient documentation

## 2019-07-09 DIAGNOSIS — W19XXXA Unspecified fall, initial encounter: Secondary | ICD-10-CM | POA: Diagnosis not present

## 2019-07-09 LAB — URINALYSIS, ROUTINE W REFLEX MICROSCOPIC
Bilirubin Urine: NEGATIVE
Glucose, UA: NEGATIVE mg/dL
Hgb urine dipstick: NEGATIVE
Ketones, ur: 20 mg/dL — AB
Nitrite: NEGATIVE
Protein, ur: >=300 mg/dL — AB
Specific Gravity, Urine: 1.017 (ref 1.005–1.030)
WBC, UA: >50 WBC/hpf — ABNORMAL HIGH (ref 0–5)
pH: 7 (ref 5.0–8.0)

## 2019-07-09 LAB — COMPREHENSIVE METABOLIC PANEL
ALT: 10 U/L (ref 0–44)
AST: 15 U/L (ref 15–41)
Albumin: 3.5 g/dL (ref 3.5–5.0)
Alkaline Phosphatase: 41 U/L (ref 38–126)
Anion gap: 12 (ref 5–15)
BUN: 26 mg/dL — ABNORMAL HIGH (ref 8–23)
CO2: 24 mmol/L (ref 22–32)
Calcium: 9.4 mg/dL (ref 8.9–10.3)
Chloride: 101 mmol/L (ref 98–111)
Creatinine, Ser: 0.91 mg/dL (ref 0.44–1.00)
GFR calc Af Amer: >60 mL/min (ref >60)
GFR calc non Af Amer: 58 mL/min — ABNORMAL LOW (ref >60)
Glucose, Bld: 107 mg/dL — ABNORMAL HIGH (ref 70–99)
Potassium: 3.4 mmol/L — ABNORMAL LOW (ref 3.5–5.1)
Sodium: 137 mmol/L (ref 135–145)
Total Bilirubin: 0.7 mg/dL (ref 0.3–1.2)
Total Protein: 6.3 g/dL — ABNORMAL LOW (ref 6.5–8.1)

## 2019-07-09 LAB — CBC WITH DIFFERENTIAL/PLATELET
Abs Immature Granulocytes: 0.02 10*3/uL (ref 0.00–0.07)
Basophils Absolute: 0 10*3/uL (ref 0.0–0.1)
Basophils Relative: 0 %
Eosinophils Absolute: 0.1 10*3/uL (ref 0.0–0.5)
Eosinophils Relative: 1 %
HCT: 38.1 % (ref 36.0–46.0)
Hemoglobin: 12.6 g/dL (ref 12.0–15.0)
Immature Granulocytes: 0 %
Lymphocytes Relative: 21 %
Lymphs Abs: 1.6 10*3/uL (ref 0.7–4.0)
MCH: 30.2 pg (ref 26.0–34.0)
MCHC: 33.1 g/dL (ref 30.0–36.0)
MCV: 91.4 fL (ref 80.0–100.0)
Monocytes Absolute: 0.5 10*3/uL (ref 0.1–1.0)
Monocytes Relative: 7 %
Neutro Abs: 5.3 10*3/uL (ref 1.7–7.7)
Neutrophils Relative %: 71 %
Platelets: 278 10*3/uL (ref 150–400)
RBC: 4.17 MIL/uL (ref 3.87–5.11)
RDW: 12.1 % (ref 11.5–15.5)
WBC: 7.6 10*3/uL (ref 4.0–10.5)
nRBC: 0 % (ref 0.0–0.2)

## 2019-07-09 LAB — LIPASE, BLOOD: Lipase: 36 U/L (ref 11–51)

## 2019-07-09 MED ORDER — CEPHALEXIN 500 MG PO CAPS: 500.0000 mg | ORAL_CAPSULE | Freq: Four times a day (QID) | ORAL | 0 refills | Status: AC

## 2019-07-09 NOTE — ED Provider Notes (Signed)
Medical screening examination/treatment/procedure(s) were conducted as a shared visit with non-physician practitioner(s) and myself.  I personally evaluated the patient during the encounter. Briefly, the patient is a 85 y.o. female with history of atrial fibrillation, tremor, memory issues who presents the ED after fall.  Patient on blood thinners.  Appears to be at her neurologic baseline, may be some low back pain but no midline spinal tenderness.  Appears comfortable.  No specific extremity tenderness.  We will get a head CT, neck CT, x-rays of low back and hip, will check basic labs.  Patient overall hemodynamically stable and appears comfortable.  Will rule out any traumatic injuries and anticipate discharge back to facility.  At nursing home she typically uses walker/wheelchair but it appears that she did not use it last night will try to go to the bathroom.  This chart was dictated using voice recognition software.  Despite best efforts to proofread,  errors can occur which can change the documentation meaning.     EKG Interpretation  Date/Time:  Thursday July 09 2019 11:49:57 EDT Ventricular Rate:  108 PR Interval:    QRS Duration: 124 QT Interval:  372 QTC Calculation: 487 R Axis:   -39 Text Interpretation:  Poor quality data, interpretation may be affected Sinus tachycardia artifact to due chronic tremor Confirmed by Lennice Sites [phone]) on 07/09/2019 11:52:36 AM           Lennice Sites, DO 07/09/19 1155

## 2019-07-09 NOTE — ED Triage Notes (Signed)
Pt here from [facility]-reports to staff that she fell out of her wheelchair unwitnessed last night at 10 pm. Pt takes Xarelto, hx of afib. Pt c/o right hip and leg pain-no deformity or crepitus. Endorses lower back pain with hx of same. Staff reports that when she initially got up this morning she was not as "perky" as she normally is, but after being up for a few hours she is at her baseline. Pt sts she hasn't eaten in three days.

## 2019-07-09 NOTE — ED Notes (Signed)
Pt's daughter requested update on discharge and had questions about exam results. This nurse advised Benjamine Mola, [place] to discuss. PA at bedside.

## 2019-07-09 NOTE — ED Notes (Signed)
Patient verbalizes understanding of discharge instructions. Opportunity for questioning and answers were provided. Armband removed by staff, pt discharged from ED via wheelchair. Pt to be discharged back to nursing home. Daughter to take pt back, which was discussed and approved by nursing facility.

## 2019-07-09 NOTE — ED Notes (Signed)
Pt passed the PO challenge, this nurse gave pt a [place] sandwich and sprite. This nurse instructed the pt to take small bites and chew thoroughly. Daughter at bedside. Will continue to monitor.

## 2019-07-09 NOTE — ED Provider Notes (Signed)
[facility] EMERGENCY DEPARTMENT Provider Note   CSN: [id number] Arrival date & time: 07/09/19  1020     History   Chief Complaint Chief Complaint  Patient presents with   Fall    HPI Tanya Little is a 85 y.o. female with a past medical history of A. fib anticoagulated with Xarelto, dementia, hypertension, essential tremor with a deep brain stimulator placed, who presents today for evaluation after a fall.  History obtained from patient's daughter, who is at bedside, and staff at her nursing facility.  Apparently at about 1230 last night patient fell.  This was not seen and current staff report that there is no note saying that she had to be helped up.  Staff reports that with her being on Xarelto and telling them that she hit her head when she fell they thought they felt a hematoma and sent her here for further evaluation.    Patient's daughter reports that patient's son died about a month ago.  Over the past week she has had poor appetite and not been acting consistent with her normal.       HPI  Past Medical History:  Diagnosis Date   Atrial fibrillation (HCC)    Barrett's esophagus    Esophageal stricture    Essential tremor    GERD (gastroesophageal reflux disease)    Hiatal hernia    Hypertension     Patient Active Problem List   Diagnosis Date Noted   GERD (gastroesophageal reflux disease) 07/19/2017   Generalized weakness 07/19/2017   Impaction of the bowels (HCC) 03/30/2017   Constipation 03/29/2017   Chronic atrial fibrillation    Protein-calorie malnutrition, severe 03/28/2017   Atrial fibrillation with rapid ventricular response (HCC) 03/28/2017   Acute dyspnea 03/27/2017   Chest pain at rest 03/27/2017   Recurrent UTI 03/27/2017   Acute encephalopathy 12/28/2016   Hyponatremia 12/28/2016   Atrial fibrillation with RVR (HCC) 12/28/2016   Acute lower UTI 12/28/2016   Left-sided weakness 12/28/2016   Esophageal  stricture 09/06/2014   Dysphagia, pharyngoesophageal phase 08/26/2014   Encounter for colorectal cancer screening 08/26/2014   Essential tremor 08/26/2014    Past Surgical History:  Procedure Laterality Date   ABDOMINAL HYSTERECTOMY     BALLOON DILATION N/A 09/06/2014   Procedure: BALLOON DILATION;  Surgeon: Louis Meckel, MD;  Location: [facility];  Service: Endoscopy;  Laterality: N/A;   DEEP BRAIN STIMULATOR PLACEMENT  02/13/2017   [facility]   ESOPHAGOGASTRODUODENOSCOPY N/A 09/06/2014   Procedure: ESOPHAGOGASTRODUODENOSCOPY (EGD);  Surgeon: Louis Meckel, MD;  Location: [facility];  Service: Endoscopy;  Laterality: N/A;   ESOPHAGOSCOPY WITH DILITATION     KNEE ARTHROSCOPY Right    ROTATOR CUFF REPAIR Right      OB History   No obstetric history on file.      Home Medications    Prior to Admission medications   Medication Sig Start Date End Date Taking? Authorizing Provider  acetaminophen (TYLENOL) 500 MG tablet Take 500 mg by mouth every 6 (six) hours as needed for mild pain.  03/14/17   [provider]  bisacodyl (DULCOLAX) 5 MG EC tablet Take 1 tablet (5 mg total) by mouth daily as needed for moderate constipation. 12/31/16   Elgergawy, Leana Roe, MD  calcium carbonate (TUMS - DOSED IN MG ELEMENTAL CALCIUM) 500 MG chewable tablet Chew 1 tablet (200 mg of elemental calcium total) by mouth 3 (three) times daily as needed for indigestion or heartburn. Patient not  taking: Reported on 10/23/2017 12/31/16   Elgergawy, Leana Roeawood S, MD  cefdinir (OMNICEF) 300 MG capsule Take 300 mg by mouth 2 (two) times daily.    [provider]  cephALEXin (KEFLEX) 500 MG capsule Take 1 capsule (500 mg total) by mouth 4 (four) times daily for 7 days. 07/09/19 07/16/19  Cristina GongHammond, Rochanda Harpham W, PA-C  donepezil (ARICEPT) 5 MG tablet Take 2 tablets (10 mg total) by mouth at bedtime. Patient taking differently: Take 20 mg by mouth at bedtime.  07/22/17   Hongalgi, Maximino GreenlandAnand D,  MD  feeding supplement, ENSURE ENLIVE, (ENSURE ENLIVE) LIQD Take 237 mLs by mouth 2 (two) times daily between meals. Patient taking differently: Take 237 mLs by mouth daily as needed (meal replacement).  12/31/16   Elgergawy, Leana Roeawood S, MD  Multiple Vitamins-Minerals (CENTRUM SILVER PO) Take 1 tablet by mouth daily.    [provider]  polyethylene glycol (MIRALAX / GLYCOLAX) packet Take 17 g by mouth 2 (two) times daily. Patient taking differently: Take 17 g by mouth daily as needed for mild constipation.  03/30/17   Rodolph Bonghompson, Daniel V, MD  potassium chloride (K-DUR,KLOR-CON) 10 MEQ tablet Take 10 mEq by mouth daily.    [provider]  propranolol ER (INDERAL LA) 80 MG 24 hr capsule Take 1 capsule (80 mg total) by mouth daily. 03/31/17   Rodolph Bonghompson, Daniel V, MD  QUEtiapine (SEROQUEL) 25 MG tablet Take 25-50 mg by mouth 3 (three) times daily. 25 MG at 9 AM and 25 MG at 3 PM. 50 Mg at night,    [provider]  ranitidine (ZANTAC) 300 MG tablet Take 300 mg by mouth daily.    [provider]  Rivaroxaban (XARELTO) 15 MG TABS tablet Take 1 tablet (15 mg total) by mouth daily with breakfast. Patient not taking: Reported on 10/23/2017 03/31/17   Rodolph Bonghompson, Daniel V, MD  rivaroxaban (XARELTO) 20 MG TABS tablet Take 20 mg by mouth daily.    [provider]  senna (SENOKOT) 8.6 MG TABS tablet Take 1 tablet (8.6 mg total) by mouth 2 (two) times daily. Patient taking differently: Take 1 tablet by mouth daily.  03/30/17   Rodolph Bonghompson, Daniel V, MD  traZODone (DESYREL) 150 MG tablet Take 150 mg by mouth at bedtime.    [provider]    Family History Family History  Problem Relation Age of Onset   Diabetes Sister    Lung cancer Brother    Cancer Other        nephew   Aneurysm Brother        x 3    Social History Social History   Tobacco Use   Smoking status: Former Smoker    Types: Cigarettes    Quit date: 08/26/2004    Years since quitting:  14.8   Smokeless tobacco: Never Used  Substance Use Topics   Alcohol use: No    Alcohol/week: 0.0 standard drinks   Drug use: No     Allergies   Sulfa antibiotics   Review of Systems Review of Systems  Unable to perform ROS: Dementia     Physical Exam Updated Vital Signs BP (!) 151/85    Pulse (!) 52    Temp 97.9 F (36.6 C) (Oral)    Resp 15    SpO2 100%   Physical Exam Vitals signs and nursing note reviewed.  Constitutional:      General: She is not in acute distress.    Appearance: She is well-developed.  Interventions: Cervical collar in place.     Comments: Frail, elderly appearing  HENT:     Head: Normocephalic and atraumatic.     Comments: There is a well healed palpable mass on the left sided vertex of the scalp where her implanted stimulator is with out other palpable contusion.   Eyes:     Conjunctiva/sclera: Conjunctivae normal.  Cardiovascular:     Rate and Rhythm: Normal rate. Rhythm irregularly irregular.     Heart sounds: No murmur.  Pulmonary:     Effort: Pulmonary effort is normal. No respiratory distress.     Breath sounds: Normal breath sounds.  Abdominal:     Palpations: Abdomen is soft.     Tenderness: There is no abdominal tenderness. There is no guarding.  Skin:    General: Skin is warm and dry.  Neurological:     Mental Status: She is alert.     Comments: Very hard of hearing.  Moves all 4 extremities spontaneously.  Pupils are 2-96mm bilaterally.  She is alert and oriented to person, place, and year.   Psychiatric:        Mood and Affect: Mood normal.      ED Treatments / Results  Labs (all labs ordered are listed, but only abnormal results are displayed) Labs Reviewed  URINALYSIS, ROUTINE W REFLEX MICROSCOPIC - Abnormal; Notable for the following components:      Result Value   Color, Urine AMBER (*)    APPearance TURBID (*)    Ketones, ur 20 (*)    Protein, ur >=300 (*)    Leukocytes,Ua MODERATE (*)    WBC, UA >50 (*)     Bacteria, UA MANY (*)    All other components within normal limits  COMPREHENSIVE METABOLIC PANEL - Abnormal; Notable for the following components:   Potassium 3.4 (*)    Glucose, Bld 107 (*)    BUN 26 (*)    Total Protein 6.3 (*)    GFR calc non Af Amer 58 (*)    All other components within normal limits  URINE CULTURE  LIPASE, BLOOD  CBC WITH DIFFERENTIAL/PLATELET    EKG EKG Interpretation  Date/Time:  Thursday July 09 2019 11:49:57 EDT Ventricular Rate:  108 PR Interval:    QRS Duration: 124 QT Interval:  372 QTC Calculation: 487 R Axis:   -39 Text Interpretation:  Poor quality data, interpretation may be affected Sinus tachycardia artifact to due chronic tremor Confirmed by Virgina Norfolk [id number]) on 07/09/2019 11:52:36 AM   Radiology Dg Chest 2 View  Result Date: 07/09/2019 CLINICAL DATA:  Chest pain after fall. EXAM: CHEST - 2 VIEW COMPARISON:  Radiograph October 23, 2017. FINDINGS: The heart size and mediastinal contours are within normal limits. Both lungs are clear. Atherosclerosis of thoracic aorta is noted. No pneumothorax is noted. Old right rib fractures are noted. IMPRESSION: No active cardiopulmonary disease. Electronically Signed   By: Lupita Raider M.D.   On: 07/09/2019 11:48   Dg Lumbar Spine Complete  Result Date: 07/09/2019 CLINICAL DATA:  Left-sided back pain after fall today. EXAM: LUMBAR SPINE - COMPLETE 4+ VIEW COMPARISON:  Radiographs of Mar 05, 2016. FINDINGS: Stable grade 1 retrolisthesis of L1-2 is noted secondary to moderate degenerative disc disease at this level. Severe degenerative disc disease is noted at L5-S1. No fracture is noted. Posterior facet joints are unremarkable. IMPRESSION: Multilevel degenerative disc disease. No acute abnormality seen in the lumbar spine. Electronically Signed   By: Lupita Raider  M.D.   On: 07/09/2019 11:50   Ct Head Wo Contrast  Result Date: 07/09/2019 CLINICAL DATA:  Trauma EXAM: CT HEAD WITHOUT CONTRAST  CT CERVICAL SPINE WITHOUT CONTRAST TECHNIQUE: Multidetector CT imaging of the head and cervical spine was performed following the standard protocol without intravenous contrast. Multiplanar CT image reconstructions of the cervical spine were also generated. COMPARISON:  12/28/2016 FINDINGS: CT HEAD FINDINGS Brain: No evidence of acute infarction, hemorrhage, hydrocephalus, extra-axial collection or mass lesion/mass effect. Periventricular and deep white matter hypodensity. Left frontal approach deep brain stimulator lead with tip position in the vicinity of the left subthalamic nucleus. 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 Vascular: No hyperdense vessel or unexpected calcification. Skull: Normal. Negative for fracture or focal lesion. Sinuses/Orbits: No acute finding. Other: None. CT CERVICAL SPINE FINDINGS Alignment: Normal. Skull base and vertebrae: No acute fracture. No primary bone lesion or focal pathologic process. Soft tissues and spinal canal: No prevertebral fluid or swelling. No visible canal hematoma. Disc levels: Moderate multilevel disc space height loss and osteophytosis. Upper chest: Negative. Other: None. IMPRESSION: 1.  No acute intracranial pathology. 2. Small-vessel white matter disease. 3.  Left frontal approach deep brain stimulator lead. 4.  No fracture or static subluxation of the cervical spine. 5.  Moderate multilevel disc degenerative disease. Electronically Signed   By: Eddie Candle M.D.   On: 07/09/2019 12:32   Ct Cervical Spine Wo Contrast  Result Date: 07/09/2019 CLINICAL DATA:  Trauma EXAM: CT HEAD WITHOUT CONTRAST CT CERVICAL SPINE WITHOUT CONTRAST TECHNIQUE: Multidetector CT imaging of the head and cervical spine was performed following the standard protocol without intravenous contrast. Multiplanar CT image reconstructions of the cervical spine were also generated. COMPARISON:  12/28/2016 FINDINGS: CT HEAD FINDINGS Brain: No evidence of acute infarction,  hemorrhage, hydrocephalus, extra-axial collection or mass lesion/mass effect. Periventricular and deep white matter hypodensity. Left frontal approach deep brain stimulator lead with tip position in the vicinity of the left subthalamic nucleus. 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 Vascular: No hyperdense vessel or unexpected calcification. Skull: Normal. Negative for fracture or focal lesion. Sinuses/Orbits: No acute finding. Other: None. CT CERVICAL SPINE FINDINGS Alignment: Normal. Skull base and vertebrae: No acute fracture. No primary bone lesion or focal pathologic process. Soft tissues and spinal canal: No prevertebral fluid or swelling. No visible canal hematoma. Disc levels: Moderate multilevel disc space height loss and osteophytosis. Upper chest: Negative. Other: None. IMPRESSION: 1.  No acute intracranial pathology. 2. Small-vessel white matter disease. 3.  Left frontal approach deep brain stimulator lead. 4.  No fracture or static subluxation of the cervical spine. 5.  Moderate multilevel disc degenerative disease. Electronically Signed   By: Eddie Candle M.D.   On: 07/09/2019 12:32   Dg Hip Unilat With Pelvis 2-3 Views Left  Result Date: 07/09/2019 CLINICAL DATA:  Left hip pain after fall today. EXAM: DG HIP (WITH OR WITHOUT PELVIS) 2-3V LEFT COMPARISON:  None. FINDINGS: There is no evidence of hip fracture or dislocation. There is no evidence of arthropathy or other focal bone abnormality. IMPRESSION: Negative. Electronically Signed   By: Marijo Conception M.D.   On: 07/09/2019 11:51    Procedures Procedures (including critical care time)  Medications Ordered in ED Medications - No data to display   Initial Impression / Assessment and Plan / ED Course  I have reviewed the triage vital signs and the nursing notes.  Pertinent labs & imaging results that were available during my care of  the patient were reviewed by me and considered in my medical decision making (see chart  for details).  Clinical Course as of Jul 08 1504  Thu Jul 09, 2019  1032 Foye Clock About 1230 fell, right hip pain, hard to wake up, She reports that patient's baseline is that she is aware to person, place, and time however has difficulties with recent/short-term memory.  She normally walks with a walker however does have a wheelchair.  They report that difficulty waking her up this morning.     [EH]    Clinical Course User Index [EH] Cristina Gong, PA-C      Patient presents today for evaluation of a possible fall at the nursing facility last night which was unwitnessed.  Her physical exam is reassuring without significant hematoma raccoon eyes or battle signs.  CT head and neck along with chest x-ray left hip and L-spine x-rays were obtained without evidence of fracture, dislocation, or other acute abnormalities.  According to her daughter she has been different recently.  Labs are obtained and reviewed she does not have any significant hematologic or electrolyte derangements.  She has reportedly had poor p.o. intake recently however her daughter notes that she is also grieving the death of her son.  Patient does not have a surgical abdomen.   UA obtained with over 50 WBCs, 21-50 red cells many bacteria.  It is contaminated with 11-20 squamous epithelial cells.  I spoke with patient's daughter, who is her power of attorney, about an in and out cath which they made the informed decision to decline.  Urine was sent for culture.  She is been afebrile, not tachycardic or tachypneic without leukocytosis, does not meet Sirs/sepsis criteria.  We will give prescription for Keflex.  Patient and daughter agreeable for discharge back to SNF.  Final Clinical Impressions(s) / ED Diagnoses   Final diagnoses:  Fall, initial encounter  Acute cystitis with hematuria    ED Discharge Orders         Ordered    cephALEXin (KEFLEX) 500 MG capsule  4 times daily     07/09/19 1403             Cristina Gong, PA-C 07/09/19 1508    Virgina Norfolk, DO 07/09/19 1542

## 2019-07-09 NOTE — Discharge Instructions (Addendum)
You may have diarrhea from the antibiotics.  It is very important that you continue to take the antibiotics even if you get diarrhea unless a medical professional tells you that you may stop taking them.  If you stop too early the bacteria you are being treated for will become stronger and you may need different, more powerful antibiotics that have more side effects and worsening diarrhea.  Please stay well hydrated and consider probiotics as they may decrease the severity of your diarrhea.   °

## 2019-07-09 NOTE — ED Notes (Signed)
Patient transported to CT 

## 2019-07-11 LAB — URINE CULTURE: Culture: >=100000 — AB

## 2019-07-12 ENCOUNTER — Telehealth: Payer: Self-pay | Admitting: Emergency Medicine

## 2019-07-12 NOTE — Telephone Encounter (Signed)
Post ED Visit - Positive Culture Follow-up  Culture report reviewed by antimicrobial stewardship pharmacist: [place] Team []  Elenor Quinones, Pharm.D. []  Heide Guile, Pharm.D., BCPS AQ-ID []  Parks Neptune, Pharm.D., BCPS []  Alycia Rossetti, Pharm.D., BCPS []  [place], Pharm.D., BCPS, AAHIVP []  Legrand Como, Pharm.D., BCPS, AAHIVP []  Salome Arnt, PharmD, BCPS []  Johnnette Gourd, PharmD, BCPS []  Hughes Better, PharmD, BCPS [x]  Elicia Lamp, PharmD []  Laqueta Linden, PharmD, BCPS []  Albertina Parr, PharmD  [place] Team []  Leodis Sias, PharmD []  Lindell Spar, PharmD []  Royetta Asal, PharmD []  Graylin Shiver, Rph []  Rema Fendt) Glennon Mac, PharmD []  Arlyn Dunning, PharmD []  Netta Cedars, PharmD []  Dia Sitter, PharmD []  Leone Haven, PharmD []  Gretta Arab, PharmD []  Theodis Shove, PharmD []  Peggyann Juba, PharmD []  Reuel Boom, PharmD   Positive urine culture Treated with Cephalexin, organism sensitive to the same and no further patient follow-up is required at this time.  Sandi Raveling Zaidee Rion 07/12/2019, 2:47 PM

## 2020-06-27 IMAGING — CT CT HEAD W/O CM
4 series · 15 of 47 positions shown, 17 images · non-contrast
Comparison: 12/28/2016

CLINICAL DATA: Trauma

EXAM:
CT HEAD WITHOUT CONTRAST
CT CERVICAL SPINE WITHOUT CONTRAST
TECHNIQUE: Multidetector CT imaging of the head and cervical spine was
performed following the standard protocol without intravenous
contrast. Multiplanar CT image reconstructions of the cervical spine
were also generated.

[Series 3: head without · axial · non-contrast · 0.39mm/px · z∈[-103,+12]mm · 7 of 31 slices shown, 9 images]
[im 4/31  brain]
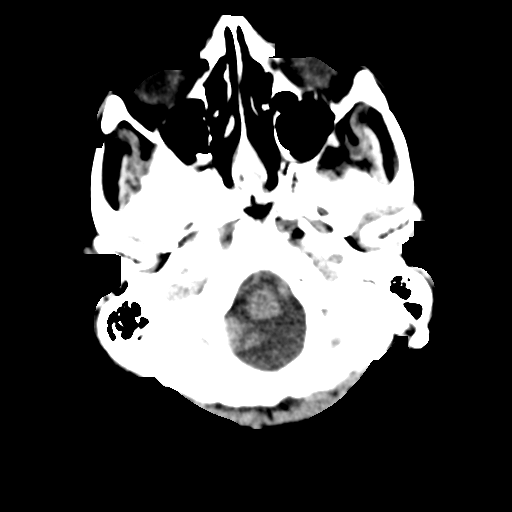
[im 4/31  bone]
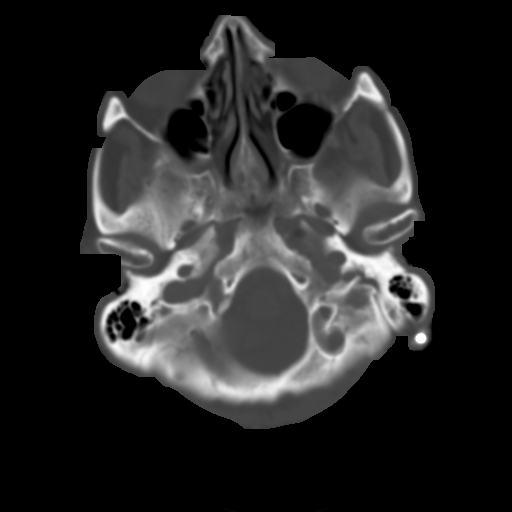
[im 8/31  brain]
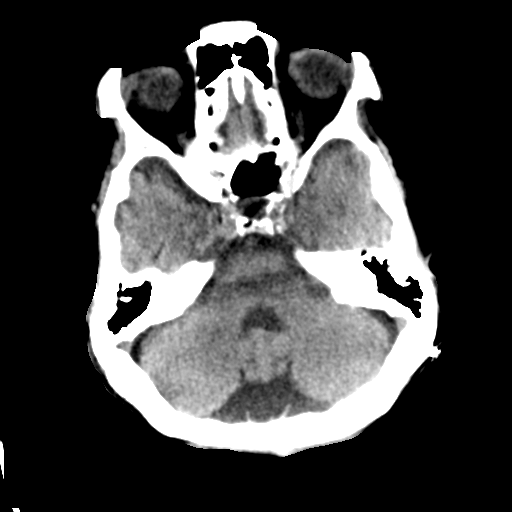
[im 12/31  brain]
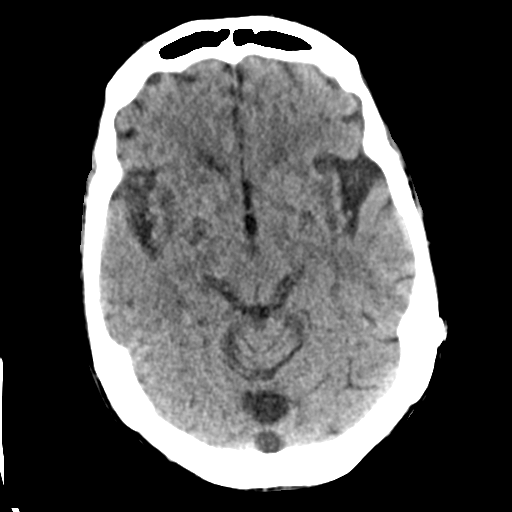
[im 16/31  brain]
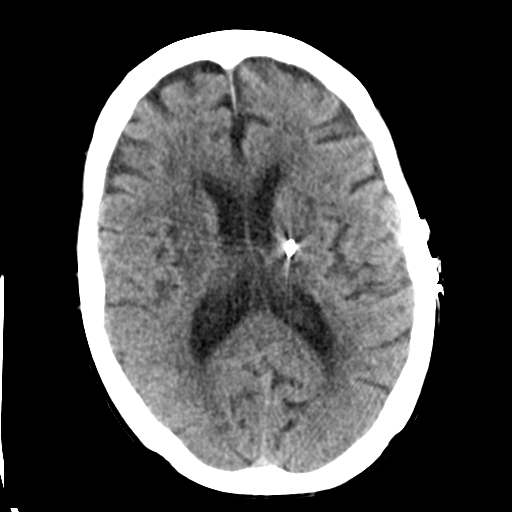
[im 19/31  brain]
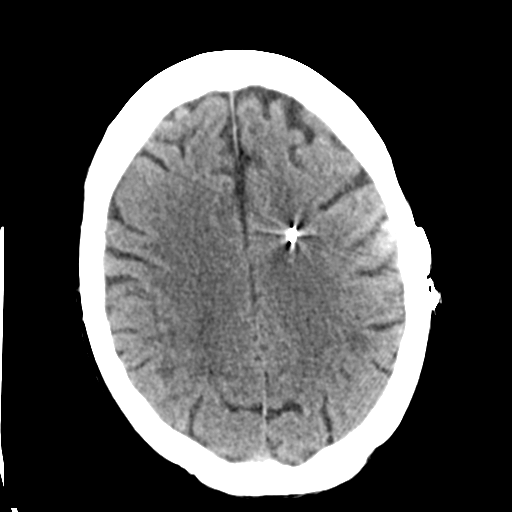
[im 19/31  bone]
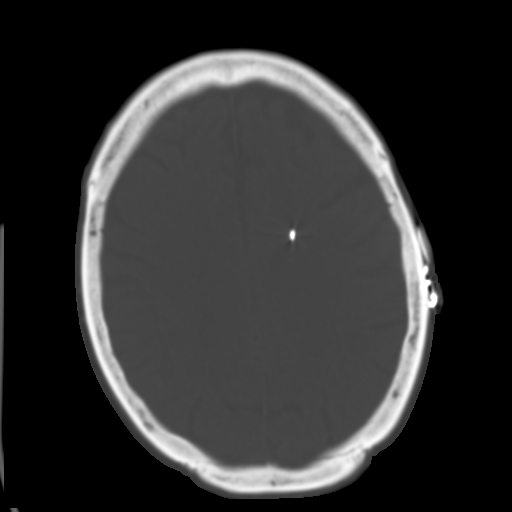
[im 23/31  brain]
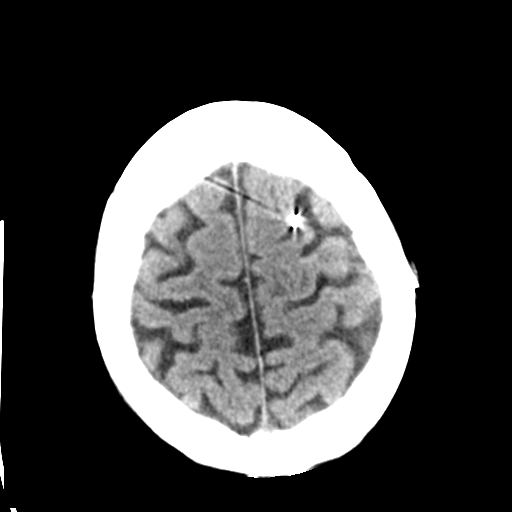
[im 27/31  brain]
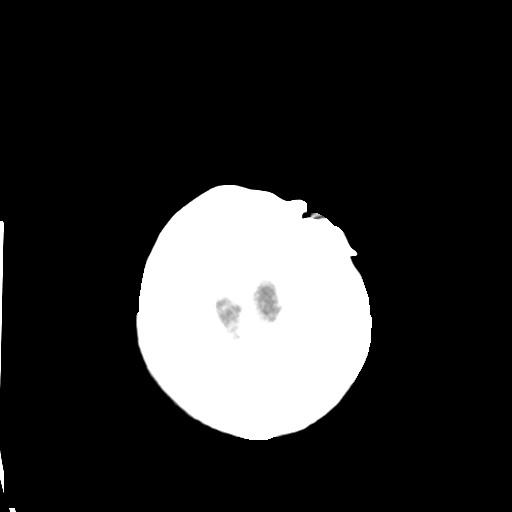

[Series 4: head bone · axial · 0.39mm/px · z∈[-104,-88]mm · 2 of 77 slices shown]
[im 8/77  bone]
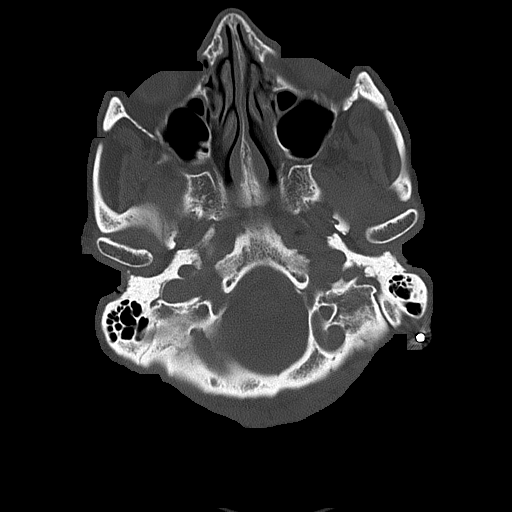
[im 16/77  bone]
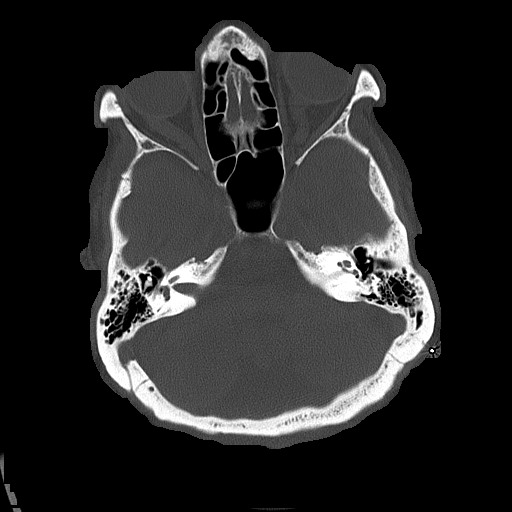

[Series 5: head without cor · coronal · non-contrast · 0.37mm/px · 3 of 67 slices shown]
[im 23/67  brain]
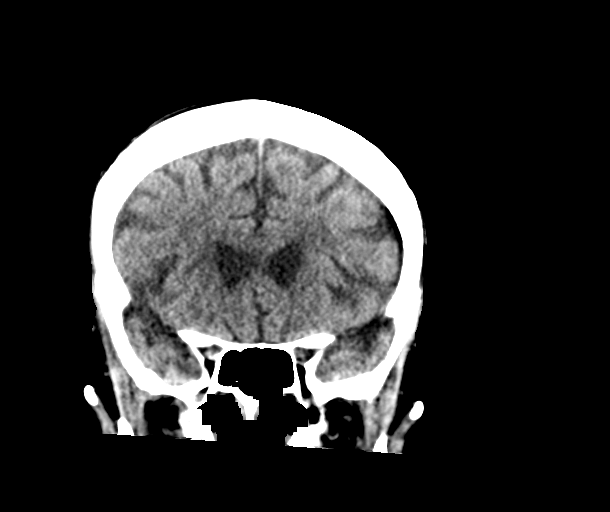
[im 30/67  brain]
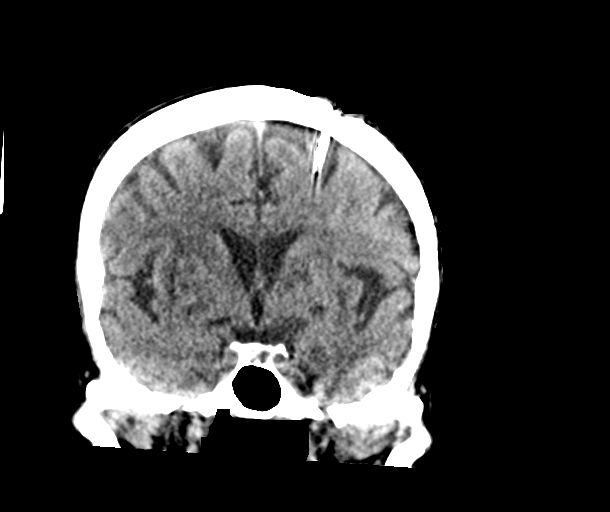
[im 37/67  brain]
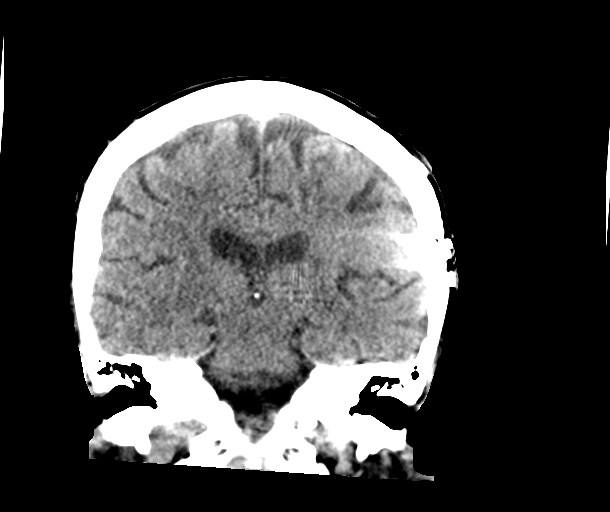

[Series 6: head without sag · sagittal · non-contrast · 0.33mm/px · 3 of 65 slices shown]
[im 22/65  brain]
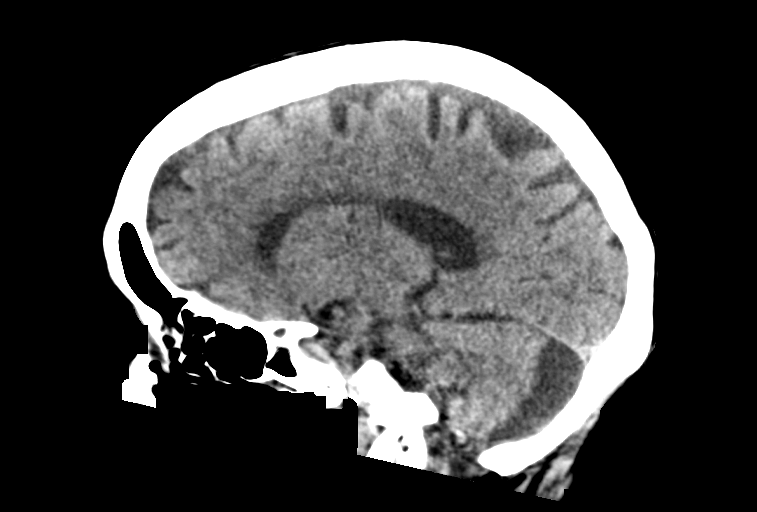
[im 32/65  brain]
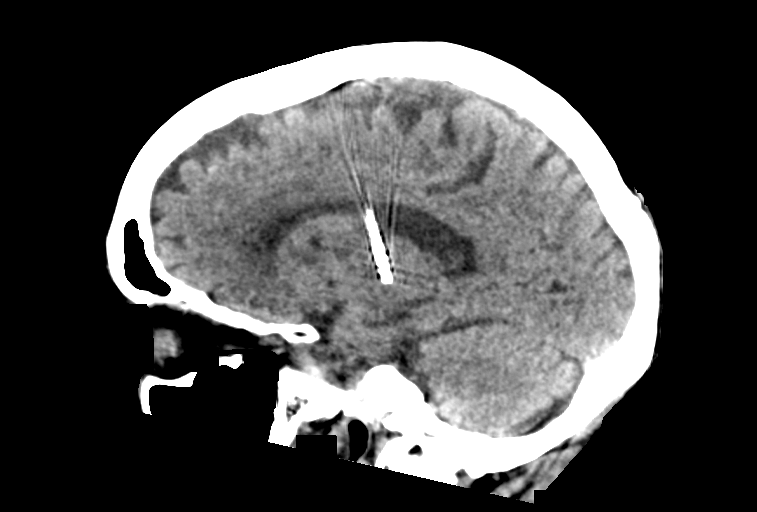
[im 42/65  brain]
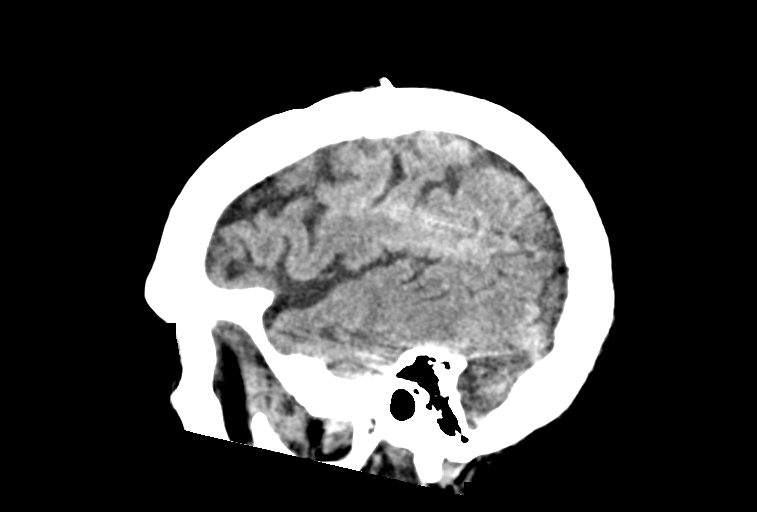

[15 of 47 positions shown; findings below may reference images not displayed]

FINDINGS: CT HEAD FINDINGS

Brain: No evidence of acute infarction, hemorrhage, hydrocephalus,
extra-axial collection or mass lesion/mass effect. Periventricular
and deep white matter hypodensity. Left frontal approach deep brain
stimulator lead with tip position in the vicinity of the left
subthalamic nucleus. Mega cisterna magna variant of the posterior
fossa.

Vascular: No hyperdense vessel or unexpected calcification.

Skull: Normal. Negative for fracture or focal lesion.

Sinuses/Orbits: No acute finding.

Other: None.

CT CERVICAL SPINE FINDINGS

Alignment: Normal.

Skull base and vertebrae: No acute fracture. No primary bone lesion
or focal pathologic process.

Soft tissues and spinal canal: No prevertebral fluid or swelling. No
visible canal hematoma.

Disc levels: Moderate multilevel disc space height loss and
osteophytosis.

Upper chest: Negative.

Other: None.
IMPRESSION: 1.  No acute intracranial pathology.

2. Small-vessel white matter disease.

3.  Left frontal approach deep brain stimulator lead.

4.  No fracture or static subluxation of the cervical spine.

5.  Moderate multilevel disc degenerative disease.

## 2022-06-15 DEATH — deceased
